# Patient Record
Sex: Male | Born: 2000
Health system: Southern US, Community
[De-identification: ages and names within clinical notes are randomized; demographics above are authoritative.]

## PROBLEM LIST (undated history)

## (undated) DIAGNOSIS — Q203 Discordant ventriculoarterial connection: Secondary | ICD-10-CM

## (undated) DIAGNOSIS — K219 Gastro-esophageal reflux disease without esophagitis: Secondary | ICD-10-CM

## (undated) HISTORY — PX: OTHER SURGICAL HISTORY: SHX169

## (undated) HISTORY — PX: TONSILLECTOMY: SUR1361

## (undated) HISTORY — PX: CARDIAC SURGERY: SHX584

---
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
## 2022-04-01 ENCOUNTER — Emergency Department (HOSPITAL_BASED_OUTPATIENT_CLINIC_OR_DEPARTMENT_OTHER)
Admission: EM | Admit: 2022-04-01 | Discharge: 2022-04-01 | Disposition: A | Discharge status: Home / Self Care | Payer: Medicaid Other | Attending: Emergency Medicine | Admitting: Emergency Medicine

## 2022-04-01 ENCOUNTER — Encounter (HOSPITAL_BASED_OUTPATIENT_CLINIC_OR_DEPARTMENT_OTHER): Payer: Self-pay | Admitting: Emergency Medicine

## 2022-04-01 ENCOUNTER — Other Ambulatory Visit: Payer: Self-pay

## 2022-04-01 ENCOUNTER — Emergency Department (HOSPITAL_BASED_OUTPATIENT_CLINIC_OR_DEPARTMENT_OTHER): Payer: Medicaid Other

## 2022-04-01 DIAGNOSIS — R079 Chest pain, unspecified: Secondary | ICD-10-CM

## 2022-04-01 DIAGNOSIS — R0789 Other chest pain: Secondary | ICD-10-CM | POA: Insufficient documentation

## 2022-04-01 LAB — CBC
HCT: 57.3 % — ABNORMAL HIGH (ref 39.0–52.0)
Hemoglobin: 20.5 g/dL — ABNORMAL HIGH (ref 13.0–17.0)
MCH: 30.3 pg (ref 26.0–34.0)
MCHC: 35.8 g/dL (ref 30.0–36.0)
MCV: 84.8 fL (ref 80.0–100.0)
Platelets: 286 10*3/uL (ref 150–400)
RBC: 6.76 MIL/uL — ABNORMAL HIGH (ref 4.22–5.81)
RDW: 12.8 % (ref 11.5–15.5)
WBC: 8.9 10*3/uL (ref 4.0–10.5)
nRBC: 0 % (ref 0.0–0.2)

## 2022-04-01 LAB — BASIC METABOLIC PANEL
Anion gap: 6 (ref 5–15)
BUN: 11 mg/dL (ref 6–20)
CO2: 27 mmol/L (ref 22–32)
Calcium: 8.7 mg/dL — ABNORMAL LOW (ref 8.9–10.3)
Chloride: 108 mmol/L (ref 98–111)
Creatinine, Ser: 0.97 mg/dL (ref 0.61–1.24)
GFR, Estimated: >60 mL/min (ref >60)
Glucose, Bld: 129 mg/dL — ABNORMAL HIGH (ref 70–99)
Potassium: 4.2 mmol/L (ref 3.5–5.1)
Sodium: 141 mmol/L (ref 135–145)

## 2022-04-01 LAB — TROPONIN I (HIGH SENSITIVITY): Troponin I (High Sensitivity): 3 ng/L (ref <18)

## 2022-04-01 NOTE — ED Notes (Signed)
Hx of heart problem. Surgery when infant to correct heart defect. Alert x4 . Denies any shortness of breath.Pain in 1 in chest area . Denies any other symptoms beside nausea

## 2022-04-01 NOTE — ED Notes (Signed)
Pt remains with his feet up. States he feels better, still noticeably pale in color. HR is 82bpm. States he feels "gassy, a little dizzy, slight chest pain and chills."

## 2022-04-01 NOTE — ED Notes (Signed)
Immediately following blood draw, pt states "I feel dizzy. My head is fuzzy and I have tunnel vision." Pulse ox applied and HR read 62 BPM and 98SpO2. Pt states "I'm about to pass out."   Pt's feet raised and head laid back in stretcher. Pt noticeably pale in color and diaphoretic. RN informed and Repeat EKG performed.

## 2022-04-01 NOTE — ED Provider Notes (Signed)
[facility] EMERGENCY DEPARTMENT Provider Note   CSN: [id number] Arrival date & time: 04/01/22  1232     History  Chief Complaint  Patient presents with   Chest Pain    Steve Henson is a 21 y.o. male with a past medical history of repair for transposition of the cardiac great arteries presenting today with chest pain.  He reports that this has been going on for a year.  Says the pains are intermittent and sometimes feels sharp, sometimes feel dull, sometimes feel crampy and that there is no rhyme or reason.  No associated shortness of breath or palpitations.  Reports donating plasma biweekly.  Today the chest pain was not worse than usual but he also was lightheaded.  This was present before he donated plasma today.  No history of DVT/PE, recent travel, recent surgery, familial coagulopathy or tobacco use.  Has seen a cardiologist, last visit in November 2022.   Chest Pain      Home Medications Prior to Admission medications   Medication Sig Start Date End Date Taking? Authorizing Provider  amoxicillin-clavulanate (AUGMENTIN) 875-125 MG tablet Take 1 tablet by mouth every 12 (twelve) hours. 02/08/21   Milagros Loll, MD  cephALEXin (KEFLEX) 500 MG capsule Take 1,000 mg by mouth 2 (two) times daily. 02/01/21   [provider]  famotidine (PEPCID) 20 MG tablet Take 1 tablet (20 mg total) by mouth 2 (two) times daily. 04/27/21   Palumbo, April, MD  potassium chloride SA (KLOR-CON) 20 MEQ tablet Take 1 tablet (20 mEq total) by mouth 2 (two) times daily. 05/07/21   Glynn Octave, MD      Allergies    Patient has no known allergies.    Review of Systems   Review of Systems  Cardiovascular:  Positive for chest pain.    Physical Exam Updated Vital Signs BP 113/71 (BP Location: Right Arm)   Pulse 74   Temp 98.5 F (36.9 C) (Oral)   Resp 18   Ht 6' (1.829 m)   Wt 90.7 kg   SpO2 100%   BMI 27.11 kg/m  Physical Exam Vitals and nursing note reviewed.   Constitutional:      Appearance: Normal appearance.  HENT:     Head: Normocephalic and atraumatic.  Eyes:     General: No scleral icterus.    Conjunctiva/sclera: Conjunctivae normal.  Cardiovascular:     Rate and Rhythm: Normal rate and regular rhythm.     Heart sounds: Normal heart sounds.  Pulmonary:     Effort: Pulmonary effort is normal. No respiratory distress.     Breath sounds: Normal breath sounds. No decreased breath sounds.  Chest:     Chest wall: No tenderness.  Musculoskeletal:        General: Normal range of motion.     Right lower leg: No edema.     Left lower leg: No edema.  Skin:    General: Skin is warm.     Findings: No rash.  Neurological:     Mental Status: He is alert.  Psychiatric:        Mood and Affect: Mood normal.    ED Results / Procedures / Treatments   Labs (all labs ordered are listed, but only abnormal results are displayed) Labs Reviewed  BASIC METABOLIC PANEL - Abnormal; Notable for the following components:      Result Value   Glucose, Bld 129 (*)    Calcium 8.7 (*)    All other  components within normal limits  CBC - Abnormal; Notable for the following components:   RBC 6.76 (*)    Hemoglobin 20.5 (*)    HCT 57.3 (*)    All other components within normal limits  TROPONIN I (HIGH SENSITIVITY)  TROPONIN I (HIGH SENSITIVITY)    EKG EKG Interpretation  Date/Time:  Saturday April 01 2022 13:02:41 EDT Ventricular Rate:  66 PR Interval:  140 QRS Duration: 88 QT Interval:  406 QTC Calculation: 425 R Axis:   96 Text Interpretation: Normal sinus rhythm Rightward axis ST elevation, consider early repolarization, pericarditis, or injury Abnormal ECG When compared with ECG of 01-Apr-2022 12:48, No significant change since last tracing Confirmed by Meridee Score [phone]) on 04/01/2022 1:08:50 PM  Radiology DG Chest 2 View  Result Date: 04/01/2022 CLINICAL DATA:  Chest pain. EXAM: CHEST - 2 VIEW COMPARISON:  July 31, 2021 FINDINGS:  Cardiomediastinal silhouette is normal. Mediastinal contours appear intact. There is no evidence of focal airspace consolidation, pleural effusion or pneumothorax. Osseous structures are without acute abnormality. Soft tissues are grossly normal. IMPRESSION: No active cardiopulmonary disease. Electronically Signed   By: Ted Mcalpine M.D.   On: 04/01/2022 14:24    Procedures Procedures   Medications Ordered in ED Medications - No data to display  ED Course/ Medical Decision Making/ A&P                           Medical Decision Making Amount and/or Complexity of Data Reviewed Labs: ordered. Radiology: ordered.   .This patient presents to the ED for concern of intermittent chest pain over the past year.  Differential includes but is not limited to ACS, PE, anxiety, electrolyte abnormality, dehydration, heart murmur.   This is not an exhaustive differential.    Past Medical History / Co-morbidities / Social History: History of cardiac surgery as an infant.   Additional history: Per chart review patient presents for similar symptoms multiple times each month.  He has not been seen by his cardiologist at [facility] since November 2022.   Physical Exam: Pertinent physical exam findings include normal cardiopulmonary exam.  Negative orthostatics  Lab Tests: I ordered, and personally interpreted labs.  The pertinent results include: Hemoglobin 20.5.  It was elevated 4 months ago as well.  Patient is PERC negative.  Doubt hypoxia secondary to    Imaging Studies: I ordered and independently visualized and interpreted imaging which showed no acute findings.   Cardiac Monitoring:  The patient was maintained on a cardiac monitor.  Patient remained in normal sinus rhythm throughout his stay   Medications: Declined the need for medication.  We discussed possible fluids secondary to dehydration and lightheadedness however patient says that he drinks plenty of water and receives a bag of  saline after donating plasma.  He did this today.  He and his mother do not believe it is necessary at this time.    Disposition: 21 year old male presenting with chest pain.  Says its been going on for a year.  Feels differently each time.  Mother says she brings him each time to make sure he is not having a heart attack.  Heart score 0 and troponin negative.  I believe is reasonable for him to follow-up with cardiology because he has not seen them since November.  He and his mother are agreeable to this.  They question whether or not he needs a heart catheterization.  I told him they should discuss this with  cardiology.  Agreeable to discharge at this time with strict return precautions.  Also given a referral to PCP where they can continue to follow his elevated hemoglobin.   Final Clinical Impression(s) / ED Diagnoses Final diagnoses:  Nonspecific chest pain    Rx / DC Orders ED Discharge Orders     None      Results and diagnoses were explained to the patient. Return precautions discussed in full. Patient had no additional questions and expressed complete understanding.   This chart was dictated using voice recognition software.  Despite best efforts to proofread,  errors can occur which can change the documentation meaning.     Woodroe Chen 04/01/22 1601    Terrilee Files, MD 04/01/22 Flossie Buffy

## 2022-04-01 NOTE — ED Triage Notes (Signed)
Pt arrives pov, steady gait, c/o generalized CP with nausea today. Hx of gerd,

## 2022-04-01 NOTE — Discharge Instructions (Signed)
It is very important for you to follow-up with cardiology.  They would be the ones to make any type of intervention if needed.  I have also attached a primary care office for you to establish care with.  Please call the number on Monday.  Return with any worsening symptoms.  Do not donate plasma on Tuesday either. It was a pleasure to meet you and I hope you feel better!

## 2022-08-14 ENCOUNTER — Emergency Department (HOSPITAL_BASED_OUTPATIENT_CLINIC_OR_DEPARTMENT_OTHER)
Admission: EM | Admit: 2022-08-14 | Discharge: 2022-08-15 | Disposition: A | Discharge status: Home / Self Care | Payer: Medicaid Other | Attending: Emergency Medicine | Admitting: Emergency Medicine

## 2022-08-14 ENCOUNTER — Encounter (HOSPITAL_BASED_OUTPATIENT_CLINIC_OR_DEPARTMENT_OTHER): Payer: Self-pay | Admitting: Urology

## 2022-08-14 ENCOUNTER — Other Ambulatory Visit: Payer: Self-pay

## 2022-08-14 DIAGNOSIS — R1031 Right lower quadrant pain: Secondary | ICD-10-CM | POA: Diagnosis present

## 2022-08-14 LAB — CBC
HCT: 50.3 % (ref 39.0–52.0)
Hemoglobin: 17.5 g/dL — ABNORMAL HIGH (ref 13.0–17.0)
MCH: 29.6 pg (ref 26.0–34.0)
MCHC: 34.8 g/dL (ref 30.0–36.0)
MCV: 85.1 fL (ref 80.0–100.0)
Platelets: 270 10*3/uL (ref 150–400)
RBC: 5.91 MIL/uL — ABNORMAL HIGH (ref 4.22–5.81)
RDW: 12.1 % (ref 11.5–15.5)
WBC: 10.1 10*3/uL (ref 4.0–10.5)
nRBC: 0 % (ref 0.0–0.2)

## 2022-08-14 LAB — COMPREHENSIVE METABOLIC PANEL
ALT: 14 U/L (ref 0–44)
AST: 16 U/L (ref 15–41)
Albumin: 3.8 g/dL (ref 3.5–5.0)
Alkaline Phosphatase: 51 U/L (ref 38–126)
Anion gap: 5 (ref 5–15)
BUN: 8 mg/dL (ref 6–20)
CO2: 30 mmol/L (ref 22–32)
Calcium: 8.7 mg/dL — ABNORMAL LOW (ref 8.9–10.3)
Chloride: 106 mmol/L (ref 98–111)
Creatinine, Ser: 1.02 mg/dL (ref 0.61–1.24)
GFR, Estimated: >60 mL/min (ref >60)
Glucose, Bld: 105 mg/dL — ABNORMAL HIGH (ref 70–99)
Potassium: 4 mmol/L (ref 3.5–5.1)
Sodium: 141 mmol/L (ref 135–145)
Total Bilirubin: 0.6 mg/dL (ref 0.3–1.2)
Total Protein: 6.4 g/dL — ABNORMAL LOW (ref 6.5–8.1)

## 2022-08-14 LAB — URINALYSIS, ROUTINE W REFLEX MICROSCOPIC
Bilirubin Urine: NEGATIVE
Glucose, UA: NEGATIVE mg/dL
Hgb urine dipstick: NEGATIVE
Ketones, ur: NEGATIVE mg/dL
Leukocytes,Ua: NEGATIVE
Nitrite: NEGATIVE
Protein, ur: NEGATIVE mg/dL
Specific Gravity, Urine: 1.02 (ref 1.005–1.030)
pH: 8.5 — ABNORMAL HIGH (ref 5.0–8.0)

## 2022-08-14 LAB — LIPASE, BLOOD: Lipase: 37 U/L (ref 11–51)

## 2022-08-14 NOTE — ED Provider Notes (Signed)
[facility] Provider Note: Lowella Dell, MD, FACEP  CSN: [id number] MRN: [id number] ARRIVAL: 08/14/22 at 1758 ROOM: [facility]03/[facility]03   CHIEF COMPLAINT  Abdominal Pain   HISTORY OF PRESENT ILLNESS  08/14/22 11:57 PM Steve Henson is a 21 y.o. male with right lower quadrant abdominal pain that started about 6 AM.  It was initially the constant but subsequently went away at rest and is now only present when he moves or palpates the right lower quadrant.  There is no pain at rest but pain with palpation is about a 3 out of 10.  He has had no associated nausea, vomiting, diarrhea, dysuria, fever or chills.   Past Medical History:  Diagnosis Date   Transposition great arteries     Past Surgical History:  Procedure Laterality Date   CARDIAC SURGERY     open heart surgery     TONSILLECTOMY      History reviewed. No pertinent family history.  Social History   Tobacco Use   Smoking status: Never    Passive exposure: Never   Smokeless tobacco: Never  Vaping Use   Vaping Use: Never used  Substance Use Topics   Alcohol use: No   Drug use: No    Prior to Admission medications   Medication Sig Start Date End Date Taking? Authorizing Provider  amoxicillin-clavulanate (AUGMENTIN) 875-125 MG tablet Take 1 tablet by mouth every 12 (twelve) hours. 02/08/21   Milagros Loll, MD  cephALEXin (KEFLEX) 500 MG capsule Take 1,000 mg by mouth 2 (two) times daily. 02/01/21   [provider]  famotidine (PEPCID) 20 MG tablet Take 1 tablet (20 mg total) by mouth 2 (two) times daily. 04/27/21   Palumbo, April, MD  potassium chloride SA (KLOR-CON) 20 MEQ tablet Take 1 tablet (20 mEq total) by mouth 2 (two) times daily. 05/07/21   Glynn Octave, MD    Allergies Patient has no known allergies.   REVIEW OF SYSTEMS  Negative except as noted here or in the History of Present Illness.   PHYSICAL EXAMINATION  Initial Vital Signs Blood pressure (!) 147/74, pulse 86,  temperature 98.1 F (36.7 C), temperature source Oral, resp. rate 18, height 6' (1.829 m), weight 90.4 kg, SpO2 97 %.  Examination General: Well-developed, well-nourished male in no acute distress; appearance consistent with age of record HENT: normocephalic; atraumatic Eyes: Normal appearance Neck: supple Heart: regular rate and rhythm Lungs: clear to auscultation bilaterally Abdomen: soft; nondistended; right lower quadrant tenderness; bowel sounds present Extremities: No deformity; full range of motion Neurologic: Awake, alert and oriented; motor function intact in all extremities and symmetric; no facial droop Skin: Warm and dry Psychiatric: Normal mood and affect   RESULTS  Summary of this visit's results, reviewed and interpreted by myself:   EKG Interpretation  Date/Time:    Ventricular Rate:    PR Interval:    QRS Duration:   QT Interval:    QTC Calculation:   R Axis:     Text Interpretation:         Laboratory Studies: Results for orders placed or performed during the hospital encounter of 08/14/22 (from the past 24 hour(s))  Lipase, blood     Status: None   Collection Time: 08/14/22  6:23 PM  Result Value Ref Range   Lipase 37 11 - 51 U/L  Comprehensive metabolic panel     Status: Abnormal   Collection Time: 08/14/22  6:23 PM  Result Value Ref Range   Sodium 141  135 - 145 mmol/L   Potassium 4.0 3.5 - 5.1 mmol/L   Chloride 106 98 - 111 mmol/L   CO2 30 22 - 32 mmol/L   Glucose, Bld 105 (H) 70 - 99 mg/dL   BUN 8 6 - 20 mg/dL   Creatinine, Ser 5.40 0.61 - 1.24 mg/dL   Calcium 8.7 (L) 8.9 - 10.3 mg/dL   Total Protein 6.4 (L) 6.5 - 8.1 g/dL   Albumin 3.8 3.5 - 5.0 g/dL   AST 16 15 - 41 U/L   ALT 14 0 - 44 U/L   Alkaline Phosphatase 51 38 - 126 U/L   Total Bilirubin 0.6 0.3 - 1.2 mg/dL   GFR, Estimated >98 >11 mL/min   Anion gap 5 5 - 15  CBC     Status: Abnormal   Collection Time: 08/14/22  6:23 PM  Result Value Ref Range   WBC 10.1 4.0 - 10.5 K/uL    RBC 5.91 (H) 4.22 - 5.81 MIL/uL   Hemoglobin 17.5 (H) 13.0 - 17.0 g/dL   HCT 91.4 78.2 - 95.6 %   MCV 85.1 80.0 - 100.0 fL   MCH 29.6 26.0 - 34.0 pg   MCHC 34.8 30.0 - 36.0 g/dL   RDW 21.3 08.6 - 57.8 %   Platelets 270 150 - 400 K/uL   nRBC 0.0 0.0 - 0.2 %  Urinalysis, Routine w reflex microscopic     Status: Abnormal   Collection Time: 08/14/22  6:23 PM  Result Value Ref Range   Color, Urine YELLOW YELLOW   APPearance CLEAR CLEAR   Specific Gravity, Urine 1.020 1.005 - 1.030   pH 8.5 (H) 5.0 - 8.0   Glucose, UA NEGATIVE NEGATIVE mg/dL   Hgb urine dipstick NEGATIVE NEGATIVE   Bilirubin Urine NEGATIVE NEGATIVE   Ketones, ur NEGATIVE NEGATIVE mg/dL   Protein, ur NEGATIVE NEGATIVE mg/dL   Nitrite NEGATIVE NEGATIVE   Leukocytes,Ua NEGATIVE NEGATIVE   Imaging Studies: No results found.  ED COURSE and MDM  Nursing notes, initial and subsequent vitals signs, including pulse oximetry, reviewed and interpreted by myself.  Vitals:   08/14/22 1821 08/14/22 1821  BP:  (!) 147/74  Pulse:  86  Resp:  18  Temp:  98.1 F (36.7 C)  TempSrc:  Oral  SpO2:  97%  Weight: 90.4 kg   Height: 6' (1.829 m)    Medications - No data to display    PROCEDURES  Procedures   ED DIAGNOSES  No diagnosis found.

## 2022-08-14 NOTE — ED Triage Notes (Signed)
RLQ pain that started at 0600 today  Denies N/V Denies fever Normal BM today

## 2022-08-15 ENCOUNTER — Emergency Department (HOSPITAL_BASED_OUTPATIENT_CLINIC_OR_DEPARTMENT_OTHER): Payer: Medicaid Other

## 2022-08-15 MED ORDER — IOHEXOL 300 MG/ML  SOLN
100.0000 mL | Freq: Once | INTRAMUSCULAR | Status: AC | PRN
  Administered 2022-08-15: 100 mL via INTRAVENOUS

## 2022-09-03 IMAGING — DX DG CHEST 1V PORT
1 series · 1 of 1 positions shown · non-contrast
Comparison: April 26, 2021

CLINICAL DATA: Chest pain and palpitations x1 week.

EXAM:
PORTABLE CHEST 1 VIEW

[chest ap]
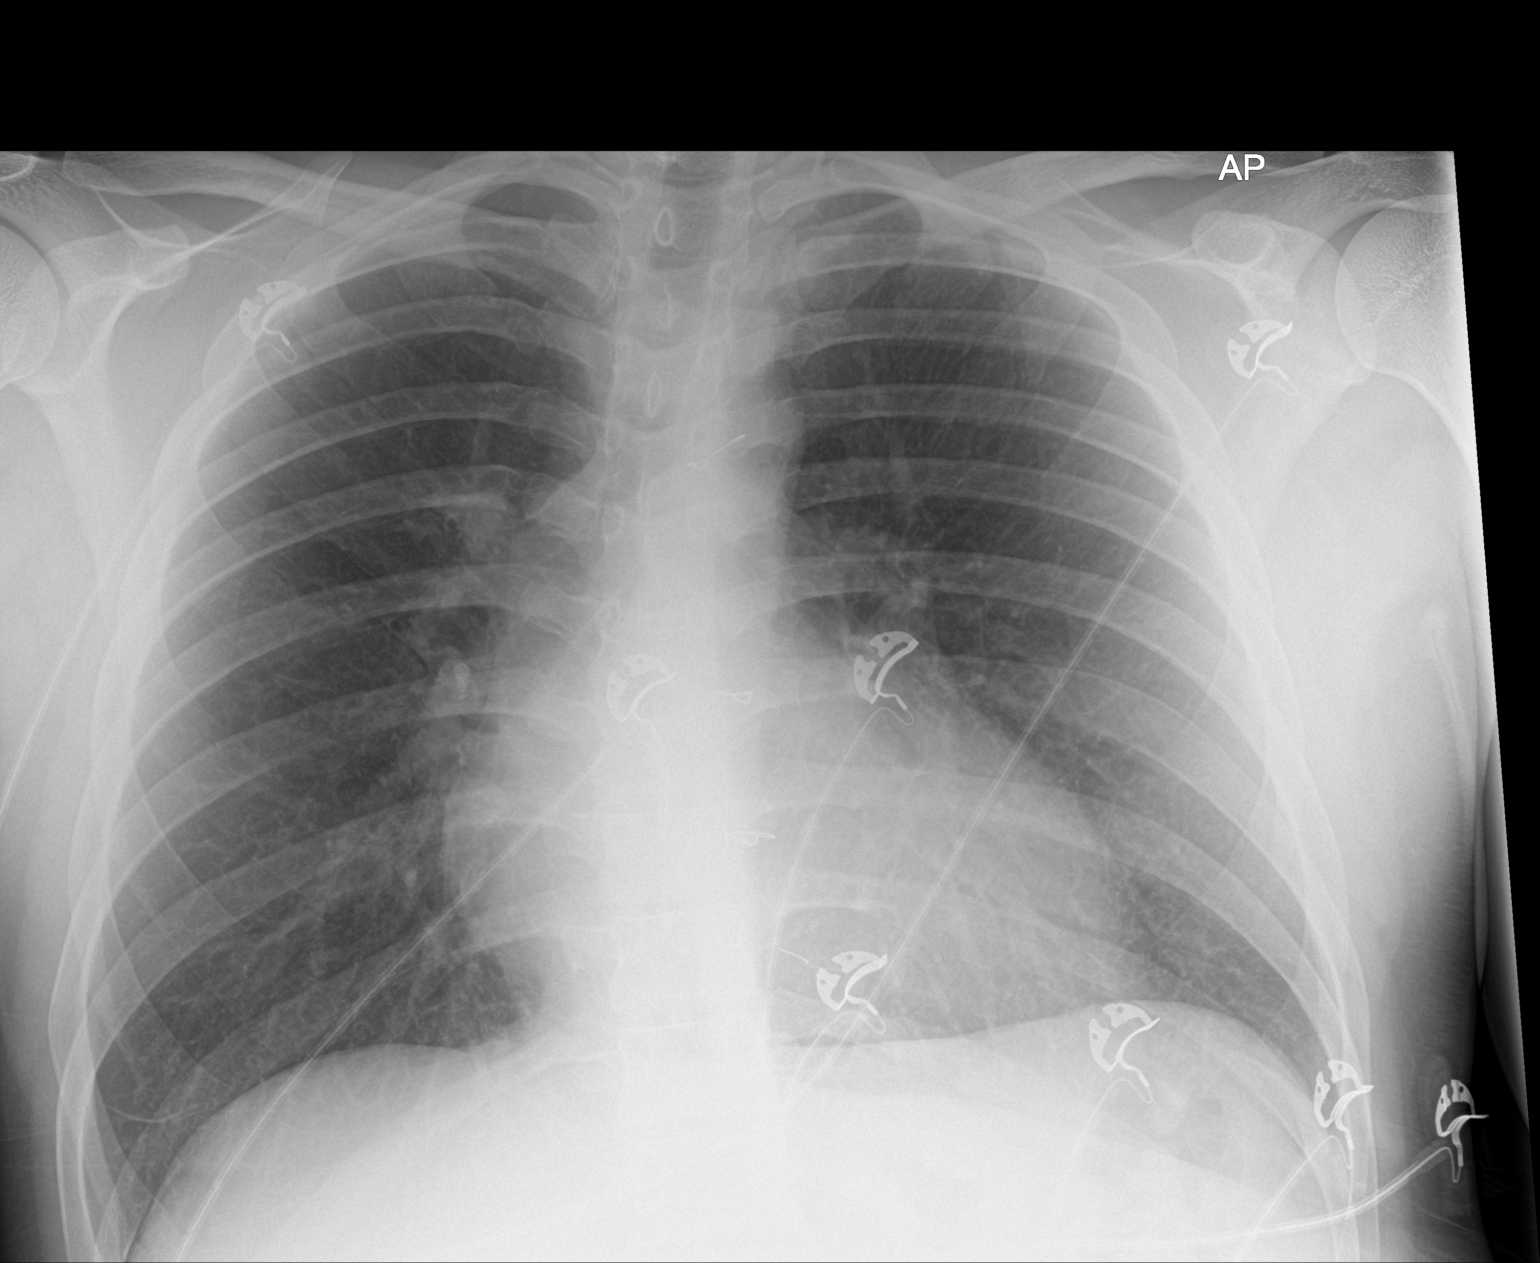

[1 of 1 positions shown; findings below may reference images not displayed]

FINDINGS: Multiple sternal wires are present. A trace amount of linear
atelectasis is seen within the lateral aspect of the right lung
base. There is no evidence of acute infiltrate, pleural effusion or
pneumothorax. The heart size and mediastinal contours are within
normal limits. The visualized skeletal structures are unremarkable.
IMPRESSION: No acute cardiopulmonary disease.

## 2022-09-13 IMAGING — DX DG CHEST 2V
2 series · 2 of 2 positions shown · non-contrast
Comparison: Chest radiograph dated 05/05/2021.

CLINICAL DATA: Chest pain.

EXAM:
CHEST - 2 VIEW

[chest pa]
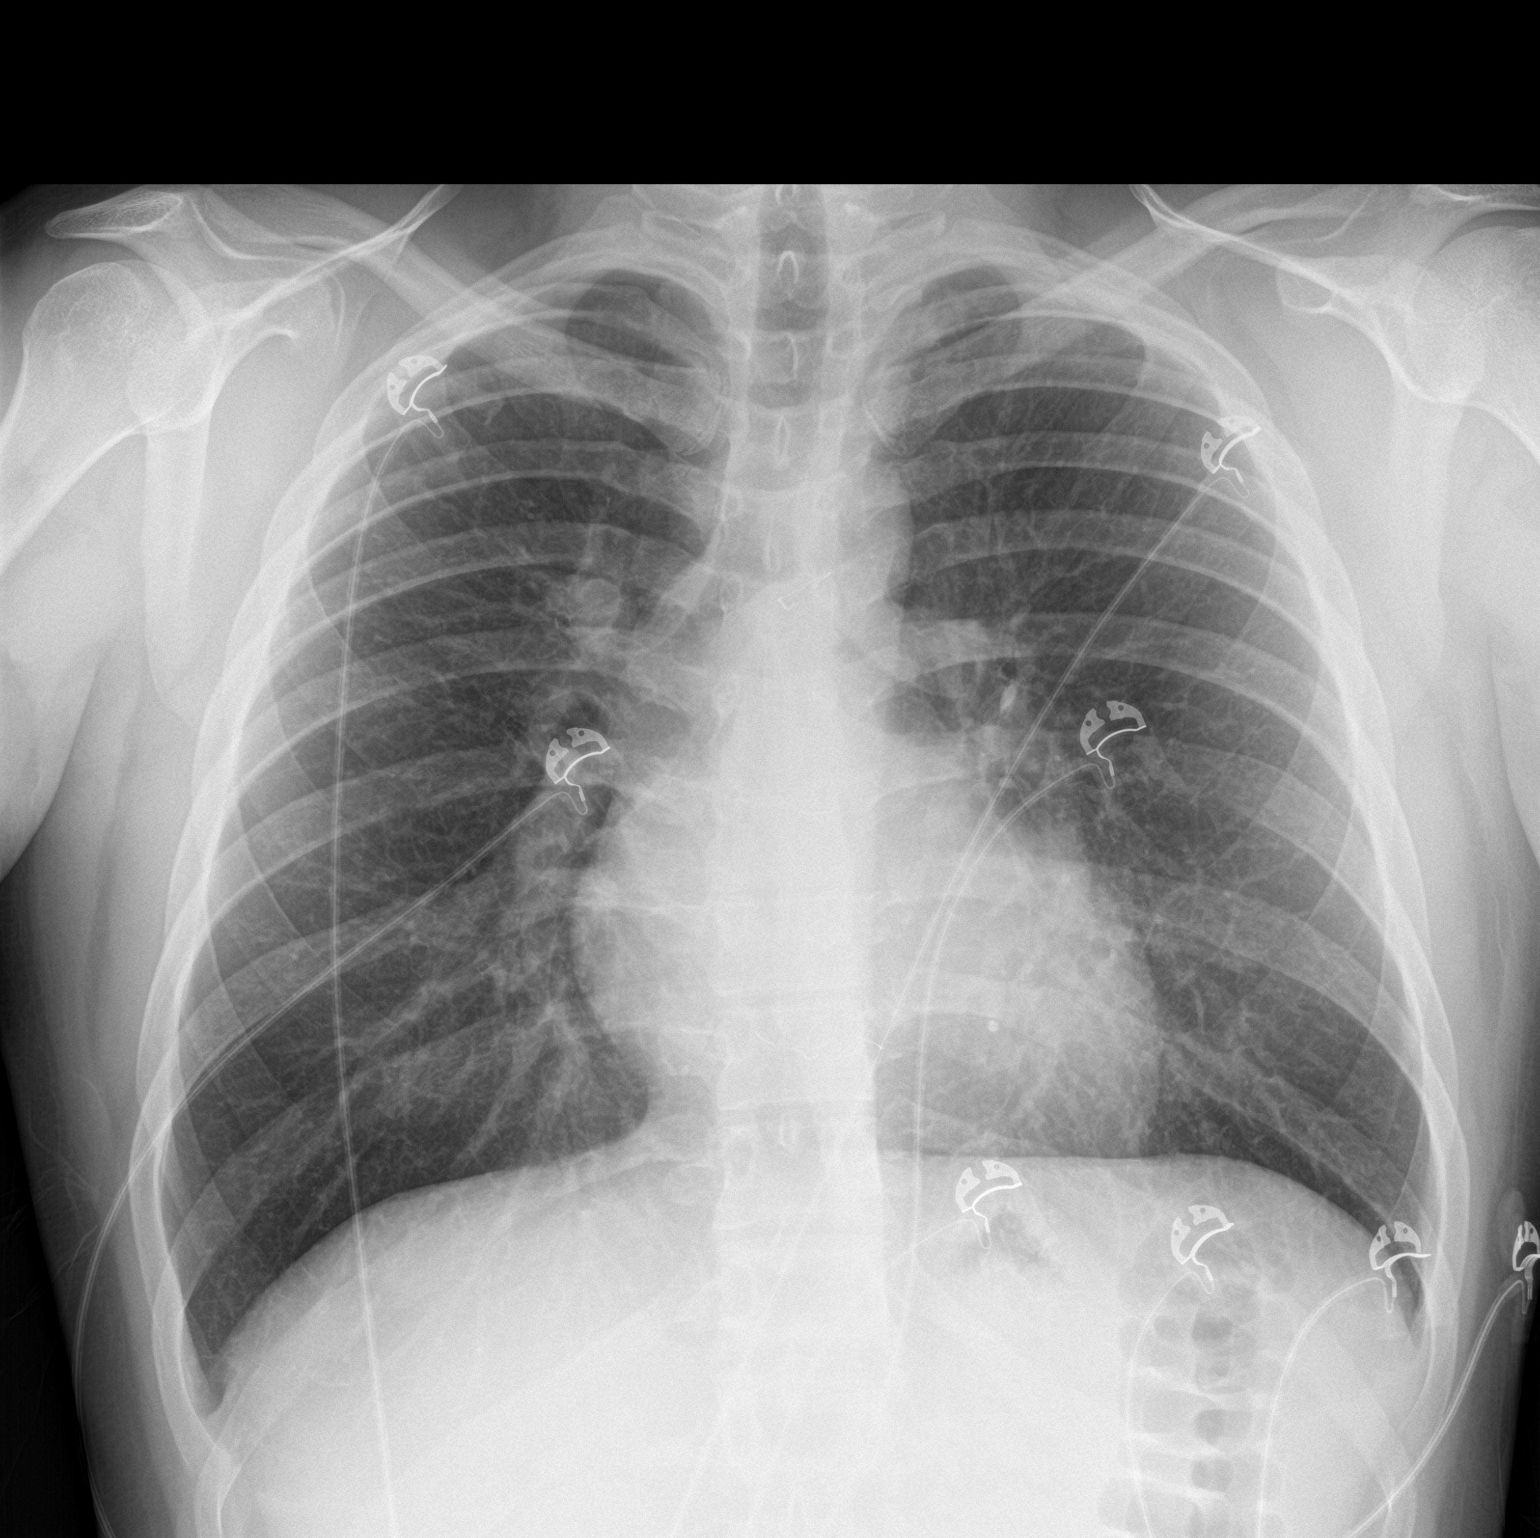

[chest lat]
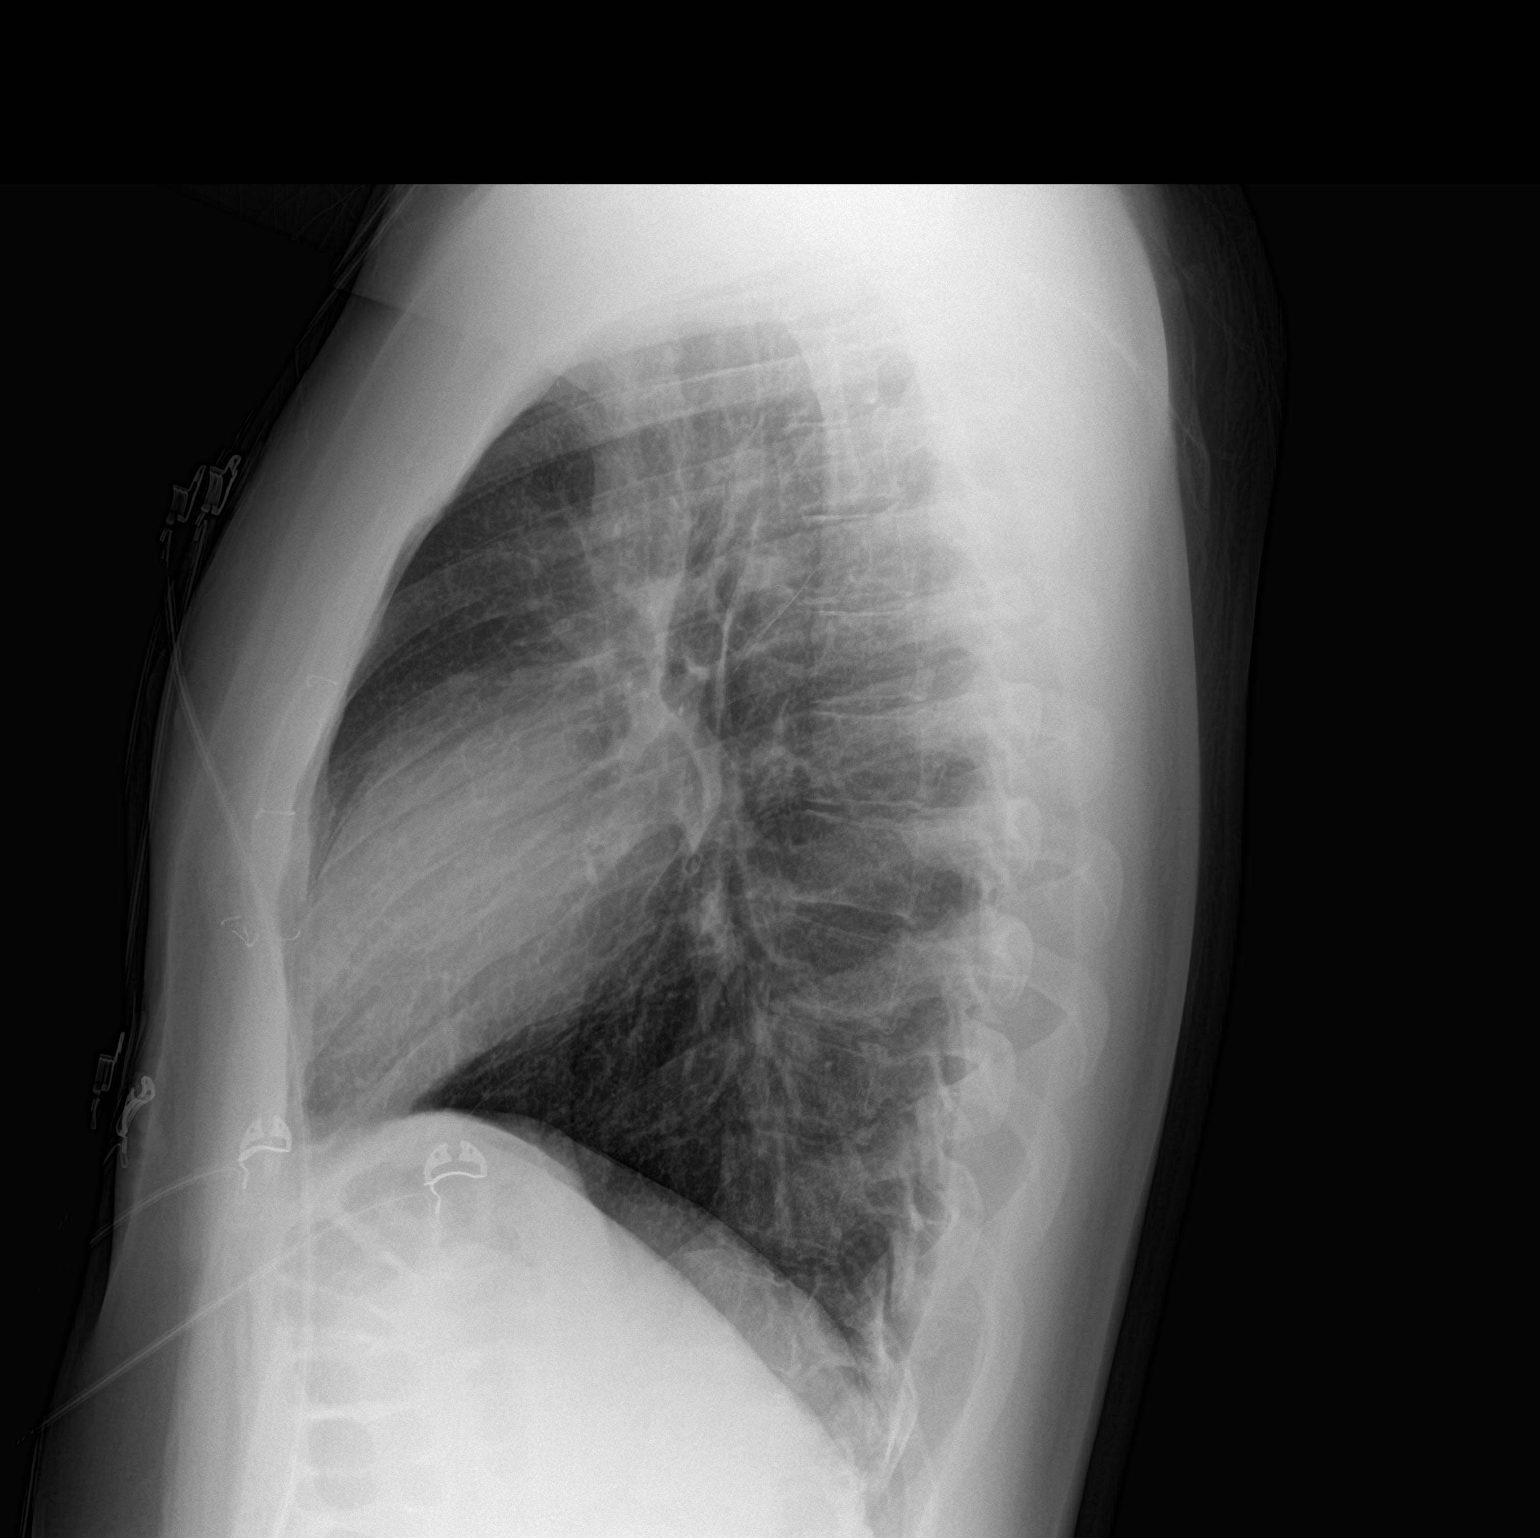

[2 of 2 positions shown; findings below may reference images not displayed]

FINDINGS: The heart size and mediastinal contours are within normal limits.
Both lungs are clear. The visualized skeletal structures are
unremarkable.
IMPRESSION: No active cardiopulmonary disease.

## 2022-09-26 IMAGING — DX DG CHEST 2V
2 series · 2 of 2 positions shown · non-contrast
Comparison: 05/07/2021

CLINICAL DATA: Chest pain

EXAM:
CHEST - 2 VIEW

[chest pa]
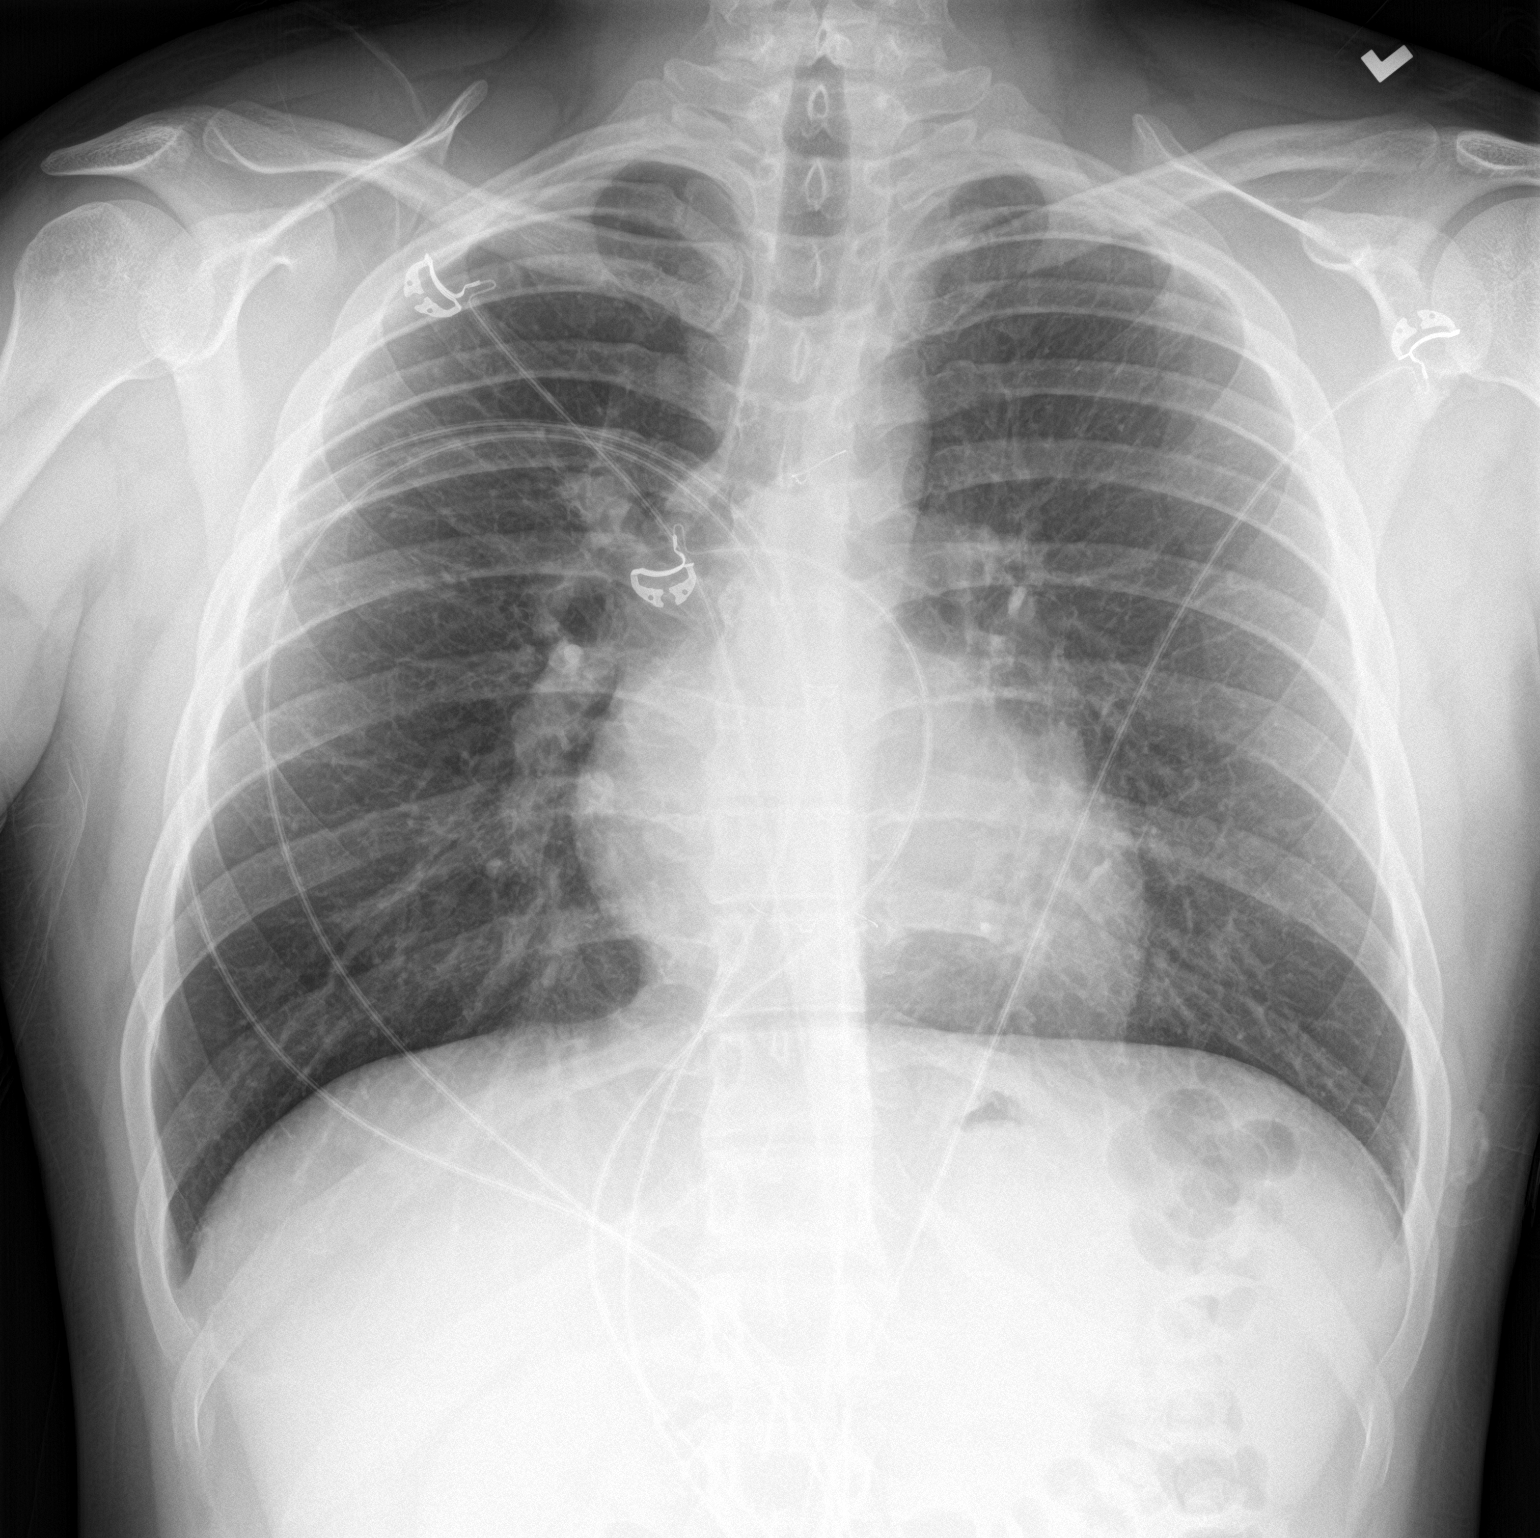

[chest lat]
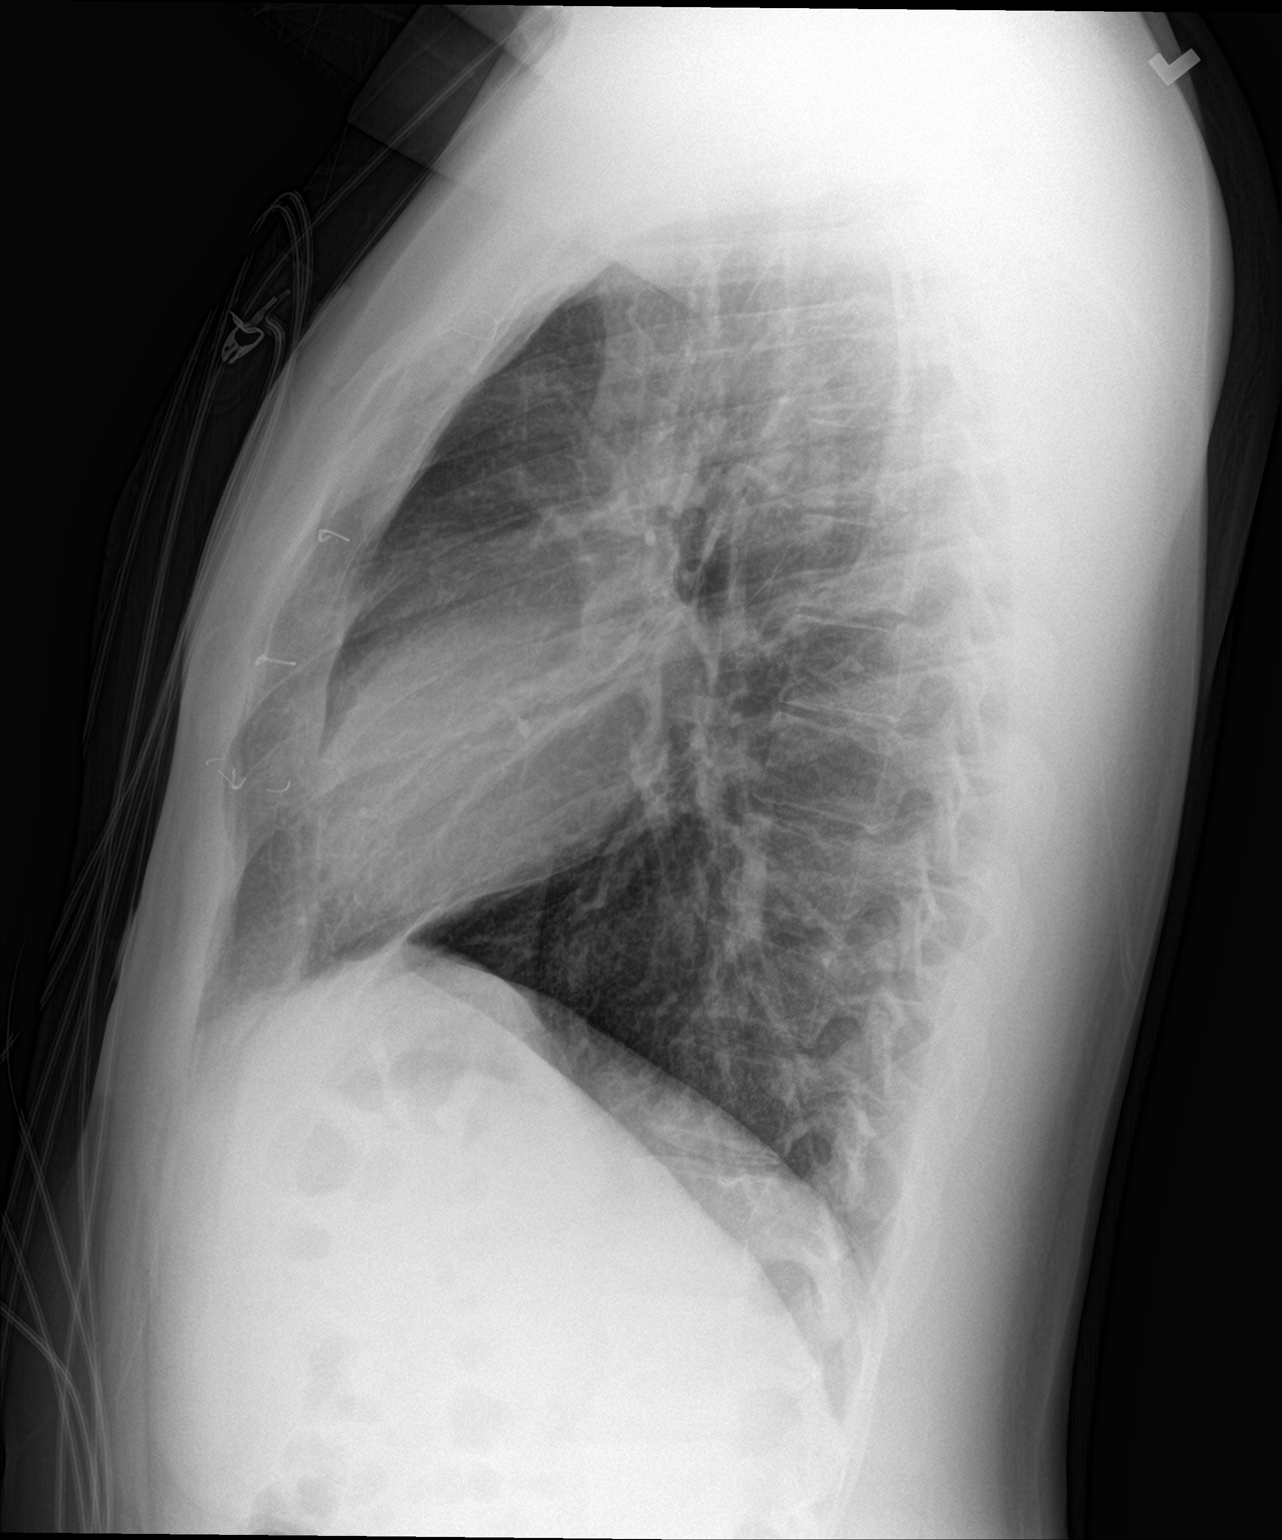

[2 of 2 positions shown; findings below may reference images not displayed]

FINDINGS: The heart size and mediastinal contours are within normal limits.
Both lungs are clear. The visualized skeletal structures are
unremarkable. Retained wires from remote prior median sternotomy.
IMPRESSION: No active cardiopulmonary disease.

## 2022-12-25 ENCOUNTER — Emergency Department (HOSPITAL_BASED_OUTPATIENT_CLINIC_OR_DEPARTMENT_OTHER): Payer: Medicaid Other

## 2022-12-25 ENCOUNTER — Other Ambulatory Visit: Payer: Self-pay

## 2022-12-25 ENCOUNTER — Encounter (HOSPITAL_BASED_OUTPATIENT_CLINIC_OR_DEPARTMENT_OTHER): Payer: Self-pay

## 2022-12-25 ENCOUNTER — Emergency Department (HOSPITAL_BASED_OUTPATIENT_CLINIC_OR_DEPARTMENT_OTHER)
Admission: EM | Admit: 2022-12-25 | Discharge: 2022-12-25 | Disposition: A | Discharge status: Home / Self Care | Payer: Medicaid Other | Attending: Emergency Medicine | Admitting: Emergency Medicine

## 2022-12-25 DIAGNOSIS — R002 Palpitations: Secondary | ICD-10-CM | POA: Diagnosis present

## 2022-12-25 DIAGNOSIS — E876 Hypokalemia: Secondary | ICD-10-CM

## 2022-12-25 HISTORY — DX: Gastro-esophageal reflux disease without esophagitis: K21.9

## 2022-12-25 LAB — CBC
HCT: 51.8 % (ref 39.0–52.0)
Hemoglobin: 18 g/dL — ABNORMAL HIGH (ref 13.0–17.0)
MCH: 29.3 pg (ref 26.0–34.0)
MCHC: 34.7 g/dL (ref 30.0–36.0)
MCV: 84.2 fL (ref 80.0–100.0)
Platelets: 223 10*3/uL (ref 150–400)
RBC: 6.15 MIL/uL — ABNORMAL HIGH (ref 4.22–5.81)
RDW: 12.5 % (ref 11.5–15.5)
WBC: 8.1 10*3/uL (ref 4.0–10.5)
nRBC: 0 % (ref 0.0–0.2)

## 2022-12-25 LAB — MAGNESIUM: Magnesium: 1.9 mg/dL (ref 1.7–2.4)

## 2022-12-25 LAB — BASIC METABOLIC PANEL
Anion gap: 11 (ref 5–15)
BUN: 9 mg/dL (ref 6–20)
CO2: 24 mmol/L (ref 22–32)
Calcium: 9.3 mg/dL (ref 8.9–10.3)
Chloride: 103 mmol/L (ref 98–111)
Creatinine, Ser: 1.01 mg/dL (ref 0.61–1.24)
GFR, Estimated: >60 mL/min (ref >60)
Glucose, Bld: 97 mg/dL (ref 70–99)
Potassium: 3.4 mmol/L — ABNORMAL LOW (ref 3.5–5.1)
Sodium: 138 mmol/L (ref 135–145)

## 2022-12-25 LAB — TROPONIN I (HIGH SENSITIVITY): Troponin I (High Sensitivity): 3 ng/L (ref <18)

## 2022-12-25 MED ORDER — POTASSIUM CHLORIDE CRYS ER 20 MEQ PO TBCR
20.0000 meq | EXTENDED_RELEASE_TABLET | Freq: Once | ORAL | Status: AC
  Administered 2022-12-25: 20 meq via ORAL
  Filled 2022-12-25: qty 1

## 2022-12-25 NOTE — ED Provider Notes (Signed)
[place] [facility] Provider Note   CSN: [id number] Arrival date & time: 12/25/22  1820     History  Chief Complaint  Patient presents with   Palpitations   HPI Steve Henson is a 22 y.o. 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  Occurred today.  States has a sensation of irregular beats that can occur from time to time but today it occurred 5 times today which was concerning to him.  Also mention that he has been lightheaded for the last week.  Denies associated shortness of breath.  States he has had some intermittent chest pain but states it is related to his GERD.  Denies chest pain at this time.   Palpitations     Home Medications Prior to Admission medications   Medication Sig Start Date End Date Taking? Authorizing Provider  famotidine (PEPCID) 20 MG tablet Take 1 tablet (20 mg total) by mouth 2 (two) times daily. 04/27/21   Palumbo, April, MD  potassium chloride SA (KLOR-CON) 20 MEQ tablet Take 1 tablet (20 mEq total) by mouth 2 (two) times daily. 05/07/21   Glynn Octave, MD      Allergies    Patient has no known allergies.    Review of Systems   Review of Systems  Cardiovascular:  Positive for palpitations.    Physical Exam   Vitals:   12/25/22 1825  BP: (!) 142/74  Pulse: 86  Resp: 20  Temp: 97.8 F (36.6 C)  SpO2: 99%    CONSTITUTIONAL:  well-appearing, NAD NEURO:  Alert and oriented x 3, CN 3-12 grossly intact EYES:  eyes equal and reactive ENT/NECK:  Supple, no stridor  CARDIO:  regular rate and rhythm, appears well-perfused  PULM:  No respiratory distress, CTAB GI/GU:  non-distended, soft MSK/SPINE:  No gross deformities, no edema, moves all extremities  SKIN:  pale, no rash, atraumatic   *Additional and/or pertinent findings included in MDM below   ED Results / Procedures / Treatments   Labs (all labs  ordered are listed, but only abnormal results are displayed) Labs Reviewed  BASIC METABOLIC PANEL - Abnormal; Notable for the following components:      Result Value   Potassium 3.4 (*)    All other components within normal limits  CBC - Abnormal; Notable for the following components:   RBC 6.15 (*)    Hemoglobin 18.0 (*)    All other components within normal limits  MAGNESIUM  TROPONIN I (HIGH SENSITIVITY)    EKG EKG Interpretation  Date/Time:  Monday December 25 2022 18:27:22 EDT Ventricular Rate:  92 PR Interval:  119 QRS Duration: 100 QT Interval:  361 QTC Calculation: 447 R Axis:   100 Text Interpretation: Sinus rhythm Borderline short PR interval Consider right ventricular hypertrophy ST elev, probable normal early repol pattern Confirmed by Glyn Ade [phone]) on 12/25/2022 8:56:56 PM  Radiology DG Chest 2 View  Result Date: 12/25/2022 CLINICAL DATA:  Palpitations. EXAM: CHEST - 2 VIEW COMPARISON:  April 01, 2022. FINDINGS: The heart size and mediastinal contours are within normal limits. Both lungs are clear. The visualized skeletal structures are unremarkable. IMPRESSION: No active cardiopulmonary disease. Electronically Signed   By: Lupita Raider M.D.   On: 12/25/2022 18:54    Procedures Procedures    Medications Ordered in ED Medications  potassium chloride SA (KLOR-CON M) CR tablet 20 mEq (20 mEq Oral Given  12/25/22 2104)    ED Course/ Medical Decision Making/ A&P Clinical Course as of 12/25/22 2106  Mon Dec 25, 2022  2055 Stable  21YOM with a history TGA Here with chief complaint of palpitations. K borderline and replace.   [CC]    Clinical Course User Index [CC] Glyn Ade, MD                             Medical Decision Making Amount and/or Complexity of Data Reviewed Labs: ordered. Radiology: ordered.   21 year old well-appearing presenting for palpitations.  Exam was unremarkable.  DDx includes arrhythmia, ACS, electrolyte  derangement, CHF or other.  Workup was overall reassuring.  Labs did reveal mild hypokalemia.  Repleted with oral potassium.  Symptoms could be related to ventricular ectopy secondary to hypokalemia.  EKG was nonischemic and without chest pain making ACS unlikely.  Advised that he follow-up with his cardiologist.  Discussed return precautions.  Vital stable discharge.        Final Clinical Impression(s) / ED Diagnoses Final diagnoses:  Hypokalemia    Rx / DC Orders ED Discharge Orders     None         Gareth Eagle, PA-C 12/25/22 2106    Glyn Ade, MD 12/26/22 [phone]

## 2022-12-25 NOTE — ED Triage Notes (Signed)
Pt reports palpitation described as cramping. Occurred x 5 today. Lightheaded x 1 week. States "chest feels empty"  Hx of GERD

## 2022-12-25 NOTE — Discharge Instructions (Signed)
Evaluation today revealed that he did have low potassium which could be causing the sensation of palpitations.  Recommend that you introduce potassium rich foods in your diet and follow-up with your cardiologist.  If you have new shortness of breath, chest pain, persistent severe palpitations or any other concerning symptom please return emergency department further evaluation.

## 2023-04-24 ENCOUNTER — Emergency Department (HOSPITAL_BASED_OUTPATIENT_CLINIC_OR_DEPARTMENT_OTHER): Payer: Medicaid Other

## 2023-04-24 ENCOUNTER — Encounter (HOSPITAL_BASED_OUTPATIENT_CLINIC_OR_DEPARTMENT_OTHER): Payer: Self-pay

## 2023-04-24 ENCOUNTER — Emergency Department (HOSPITAL_BASED_OUTPATIENT_CLINIC_OR_DEPARTMENT_OTHER)
Admission: EM | Admit: 2023-04-24 | Discharge: 2023-04-24 | Disposition: A | Discharge status: Home / Self Care | Payer: Medicaid Other | Attending: Emergency Medicine | Admitting: Emergency Medicine

## 2023-04-24 ENCOUNTER — Other Ambulatory Visit: Payer: Self-pay

## 2023-04-24 DIAGNOSIS — R5383 Other fatigue: Secondary | ICD-10-CM | POA: Diagnosis present

## 2023-04-24 DIAGNOSIS — R42 Dizziness and giddiness: Secondary | ICD-10-CM | POA: Insufficient documentation

## 2023-04-24 LAB — COMPREHENSIVE METABOLIC PANEL
ALT: 27 U/L (ref 0–44)
AST: 21 U/L (ref 15–41)
Albumin: 4.3 g/dL (ref 3.5–5.0)
Alkaline Phosphatase: 59 U/L (ref 38–126)
Anion gap: 12 (ref 5–15)
BUN: 10 mg/dL (ref 6–20)
CO2: 25 mmol/L (ref 22–32)
Calcium: 9.1 mg/dL (ref 8.9–10.3)
Chloride: 101 mmol/L (ref 98–111)
Creatinine, Ser: 1.03 mg/dL (ref 0.61–1.24)
GFR, Estimated: >60 mL/min (ref >60)
Glucose, Bld: 103 mg/dL — ABNORMAL HIGH (ref 70–99)
Potassium: 3.8 mmol/L (ref 3.5–5.1)
Sodium: 138 mmol/L (ref 135–145)
Total Bilirubin: 0.9 mg/dL (ref 0.3–1.2)
Total Protein: 7.4 g/dL (ref 6.5–8.1)

## 2023-04-24 LAB — CBC
HCT: 49 % (ref 39.0–52.0)
Hemoglobin: 17.1 g/dL — ABNORMAL HIGH (ref 13.0–17.0)
MCH: 29.6 pg (ref 26.0–34.0)
MCHC: 34.9 g/dL (ref 30.0–36.0)
MCV: 84.9 fL (ref 80.0–100.0)
Platelets: 246 10*3/uL (ref 150–400)
RBC: 5.77 MIL/uL (ref 4.22–5.81)
RDW: 12.3 % (ref 11.5–15.5)
WBC: 6.5 10*3/uL (ref 4.0–10.5)
nRBC: 0 % (ref 0.0–0.2)

## 2023-04-24 LAB — GLUCOSE, CAPILLARY: Glucose-Capillary: 93 mg/dL (ref 70–99)

## 2023-04-24 LAB — TROPONIN I (HIGH SENSITIVITY)
Troponin I (High Sensitivity): 3 ng/L (ref <18)
Troponin I (High Sensitivity): 3 ng/L (ref <18)

## 2023-04-24 NOTE — Discharge Instructions (Signed)
Recommend follow up with cardiology if symptoms of severe fatigue continue.   GI referral provided as discussed for further evaluation and management of ongoing/severe reflux.

## 2023-04-24 NOTE — ED Notes (Signed)
Delay in Triage as Patient is currently in restroom.

## 2023-04-24 NOTE — ED Triage Notes (Signed)
Patient here POV from Home with Family.  Endorses lightheadedness that began about an hour ago. Noted Pain to Right Lower chest that began soon after. Some Nausea. No Emesis. No Fevers. No discernable SOB.   Pain subsided about 20 minutes ago.   NAD Noted during Triage. A&Ox4. GCS 15. Ambulatory.

## 2023-04-24 NOTE — ED Provider Notes (Signed)
[place] [facility] Provider Note   CSN: [id number] Arrival date & time: 04/24/23  1752     History  Chief Complaint  Patient presents with   Dizziness    Steve Henson is a 22 y.o. male.  Patient to ED with sudden onset of fatigue this afternoon about 5:00 pm. He had a brief episode of pain/discomfort in the right lower chest that has resolved. No SOB, nausea, vomiting. He reports history of GERD and started omeprazole 4 days ago that he reports as ineffective. Symptoms of GERD are longstanding and without new aspect today. He also endorses lightheadedness associated with reflux also unchanged today.   The history is provided by the patient and a relative. No language interpreter was used.  Dizziness      Home Medications Prior to Admission medications   Medication Sig Start Date End Date Taking? Authorizing Provider  famotidine (PEPCID) 20 MG tablet Take 1 tablet (20 mg total) by mouth 2 (two) times daily. 04/27/21   Palumbo, April, MD  potassium chloride SA (KLOR-CON) 20 MEQ tablet Take 1 tablet (20 mEq total) by mouth 2 (two) times daily. 05/07/21   Glynn Octave, MD      Allergies    Patient has no known allergies.    Review of Systems   Review of Systems  Neurological:  Positive for dizziness.    Physical Exam Updated Vital Signs BP 130/76   Pulse 64   Temp 98.4 F (36.9 C)   Resp 17   Ht 6' (1.829 m)   Wt 97.5 kg   SpO2 98%   BMI 29.16 kg/m  Physical Exam Vitals and nursing note reviewed.  Constitutional:      General: He is not in acute distress.    Appearance: He is not ill-appearing.  Cardiovascular:     Rate and Rhythm: Normal rate and regular rhythm.     Heart sounds: No murmur heard. Pulmonary:     Breath sounds: No wheezing, rhonchi or rales.  Abdominal:     Palpations: Abdomen is soft.     Tenderness: There is no abdominal tenderness.  Musculoskeletal:        General: Normal range of motion.      Cervical back: Normal range of motion and neck supple.  Skin:    General: Skin is warm and dry.  Neurological:     Mental Status: He is alert and oriented to person, place, and time.     Sensory: No sensory deficit.     Motor: No weakness.     ED Results / Procedures / Treatments   Labs (all labs ordered are listed, but only abnormal results are displayed) Labs Reviewed  CBC - Abnormal; Notable for the following components:      Result Value   Hemoglobin 17.1 (*)    All other components within normal limits  COMPREHENSIVE METABOLIC PANEL - Abnormal; Notable for the following components:   Glucose, Bld 103 (*)    All other components within normal limits  GLUCOSE, CAPILLARY  TROPONIN I (HIGH SENSITIVITY)  TROPONIN I (HIGH SENSITIVITY)   Results for orders placed or performed during the hospital encounter of 04/24/23 (from the past 24 hour(s))  Glucose, capillary     Status: None   Collection Time: 04/24/23  6:14 PM  Result Value Ref Range   Glucose-Capillary 93 70 - 99 mg/dL   Comment 1 Notify RN   CBC     Status: Abnormal  Collection Time: 04/24/23  6:18 PM  Result Value Ref Range   WBC 6.5 4.0 - 10.5 K/uL   RBC 5.77 4.22 - 5.81 MIL/uL   Hemoglobin 17.1 (H) 13.0 - 17.0 g/dL   HCT 16.1 09.6 - 04.5 %   MCV 84.9 80.0 - 100.0 fL   MCH 29.6 26.0 - 34.0 pg   MCHC 34.9 30.0 - 36.0 g/dL   RDW 40.9 81.1 - 91.4 %   Platelets 246 150 - 400 K/uL   nRBC 0.0 0.0 - 0.2 %  Troponin I (High Sensitivity)     Status: None   Collection Time: 04/24/23  6:18 PM  Result Value Ref Range   Troponin I (High Sensitivity) 3 <18 ng/L  Comprehensive metabolic panel     Status: Abnormal   Collection Time: 04/24/23  6:18 PM  Result Value Ref Range   Sodium 138 135 - 145 mmol/L   Potassium 3.8 3.5 - 5.1 mmol/L   Chloride 101 98 - 111 mmol/L   CO2 25 22 - 32 mmol/L   Glucose, Bld 103 (H) 70 - 99 mg/dL   BUN 10 6 - 20 mg/dL   Creatinine, Ser 7.82 0.61 - 1.24 mg/dL   Calcium 9.1 8.9 - 95.6  mg/dL   Total Protein 7.4 6.5 - 8.1 g/dL   Albumin 4.3 3.5 - 5.0 g/dL   AST 21 15 - 41 U/L   ALT 27 0 - 44 U/L   Alkaline Phosphatase 59 38 - 126 U/L   Total Bilirubin 0.9 0.3 - 1.2 mg/dL   GFR, Estimated >21 >30 mL/min   Anion gap 12 5 - 15  Troponin I (High Sensitivity)     Status: None   Collection Time: 04/24/23  8:33 PM  Result Value Ref Range   Troponin I (High Sensitivity) 3 <18 ng/L     EKG EKG Interpretation Date/Time:  Tuesday April 24 2023 18:10:03 EDT Ventricular Rate:  84 PR Interval:  121 QRS Duration:  98 QT Interval:  376 QTC Calculation: 445 R Axis:   86  Text Interpretation: Sinus rhythm Consider right ventricular hypertrophy ST elev, probable normal early repol pattern No significant change since last tracing Confirmed by Benjiman Core [phone]) on 04/24/2023 9:14:31 PM  Radiology DG Chest 2 View  Result Date: 04/24/2023 CLINICAL DATA:  Chest pain. Right lower chest pain. Lightheadedness began in our ago. EXAM: CHEST - 2 VIEW COMPARISON:  12/25/2022 FINDINGS: Postoperative changes in the mediastinum. Heart size and pulmonary vascularity are normal. Lungs are clear. No pleural effusions. No pneumothorax. Mediastinal contours appear intact. IMPRESSION: No active cardiopulmonary disease. Electronically Signed   By: Burman Nieves M.D.   On: 04/24/2023 19:08    Procedures Procedures    Medications Ordered in ED Medications - No data to display  ED Course/ Medical Decision Making/ A&P                                 Medical Decision Making Amount and/or Complexity of Data Reviewed External Data Reviewed: notes.    Details: Seen by cardiology one year ago (05/11/22). Per notes during that visit, having anxiety and difficulty sleeping. Patient reports current symptoms are very different than this previous evaluation Labs: ordered.    Details: Labs unremarkable on my review Radiology: ordered.    Details: CXR clear on my review; c/w radiology  interpretation ECG/medicine tests: ordered.    Details: Unchanged when compared to  previous. Discussion of management or test interpretation with external provider(s): No explanation for severe fatigue onset earlier this afternoon. Discussed rest, relaxation. F/U cardiology given significant cardiac history (transposition of the great vessels, s/p surgical repair as an infant). Patient with significant history of reflux just started prilosec, with no focused evaluation previously. Refer to gI.            Final Clinical Impression(s) / ED Diagnoses Final diagnoses:  Other fatigue    Rx / DC Orders ED Discharge Orders     None         Danne Harbor 04/24/23 2157    Benjiman Core, MD 04/25/23 [phone]

## 2023-05-23 ENCOUNTER — Emergency Department (HOSPITAL_BASED_OUTPATIENT_CLINIC_OR_DEPARTMENT_OTHER)
Admission: EM | Admit: 2023-05-23 | Discharge: 2023-05-23 | Disposition: A | Discharge status: Home / Self Care | Payer: Medicaid Other | Attending: Emergency Medicine | Admitting: Emergency Medicine

## 2023-05-23 ENCOUNTER — Other Ambulatory Visit: Payer: Self-pay

## 2023-05-23 ENCOUNTER — Encounter (HOSPITAL_BASED_OUTPATIENT_CLINIC_OR_DEPARTMENT_OTHER): Payer: Self-pay

## 2023-05-23 DIAGNOSIS — H6592 Unspecified nonsuppurative otitis media, left ear: Secondary | ICD-10-CM | POA: Insufficient documentation

## 2023-05-23 DIAGNOSIS — R42 Dizziness and giddiness: Secondary | ICD-10-CM | POA: Insufficient documentation

## 2023-05-23 LAB — COMPREHENSIVE METABOLIC PANEL
ALT: 24 U/L (ref 0–44)
AST: 20 U/L (ref 15–41)
Albumin: 4.4 g/dL (ref 3.5–5.0)
Alkaline Phosphatase: 64 U/L (ref 38–126)
Anion gap: 7 (ref 5–15)
BUN: 13 mg/dL (ref 6–20)
CO2: 28 mmol/L (ref 22–32)
Calcium: 9.1 mg/dL (ref 8.9–10.3)
Chloride: 106 mmol/L (ref 98–111)
Creatinine, Ser: 1.05 mg/dL (ref 0.61–1.24)
GFR, Estimated: >60 mL/min (ref >60)
Glucose, Bld: 92 mg/dL (ref 70–99)
Potassium: 3.9 mmol/L (ref 3.5–5.1)
Sodium: 141 mmol/L (ref 135–145)
Total Bilirubin: 0.8 mg/dL (ref 0.3–1.2)
Total Protein: 7.3 g/dL (ref 6.5–8.1)

## 2023-05-23 LAB — CBC WITH DIFFERENTIAL/PLATELET
Abs Immature Granulocytes: 0.02 10*3/uL (ref 0.00–0.07)
Basophils Absolute: 0.1 10*3/uL (ref 0.0–0.1)
Basophils Relative: 1 %
Eosinophils Absolute: 0.2 10*3/uL (ref 0.0–0.5)
Eosinophils Relative: 4 %
HCT: 50.3 % (ref 39.0–52.0)
Hemoglobin: 17.5 g/dL — ABNORMAL HIGH (ref 13.0–17.0)
Immature Granulocytes: 0 %
Lymphocytes Relative: 26 %
Lymphs Abs: 1.7 10*3/uL (ref 0.7–4.0)
MCH: 29.5 pg (ref 26.0–34.0)
MCHC: 34.8 g/dL (ref 30.0–36.0)
MCV: 84.7 fL (ref 80.0–100.0)
Monocytes Absolute: 0.6 10*3/uL (ref 0.1–1.0)
Monocytes Relative: 9 %
Neutro Abs: 3.9 10*3/uL (ref 1.7–7.7)
Neutrophils Relative %: 60 %
Platelets: 273 10*3/uL (ref 150–400)
RBC: 5.94 MIL/uL — ABNORMAL HIGH (ref 4.22–5.81)
RDW: 11.9 % (ref 11.5–15.5)
WBC: 6.5 10*3/uL (ref 4.0–10.5)
nRBC: 0 % (ref 0.0–0.2)

## 2023-05-23 LAB — CBG MONITORING, ED: Glucose-Capillary: 109 mg/dL — ABNORMAL HIGH (ref 70–99)

## 2023-05-23 LAB — MAGNESIUM: Magnesium: 2.1 mg/dL (ref 1.7–2.4)

## 2023-05-23 LAB — TROPONIN I (HIGH SENSITIVITY): Troponin I (High Sensitivity): 3 ng/L (ref <18)

## 2023-05-23 MED ORDER — FAMOTIDINE 20 MG PO TABS: 20.0000 mg | ORAL_TABLET | Freq: Two times a day (BID) | ORAL | 0 refills | Status: AC

## 2023-05-23 MED ORDER — MECLIZINE HCL 25 MG PO TABS
25.0000 mg | ORAL_TABLET | Freq: Once | ORAL | Status: AC
  Administered 2023-05-23: 25 mg via ORAL
  Filled 2023-05-23: qty 1

## 2023-05-23 MED ORDER — MECLIZINE HCL 25 MG PO TABS: 25.0000 mg | ORAL_TABLET | Freq: Three times a day (TID) | ORAL | 0 refills | Status: AC | PRN

## 2023-05-23 MED ORDER — AMOXICILLIN 500 MG PO CAPS: 500.0000 mg | ORAL_CAPSULE | Freq: Three times a day (TID) | ORAL | 0 refills | Status: DC

## 2023-05-23 MED ORDER — AMOXICILLIN 500 MG PO CAPS: 500.0000 mg | ORAL_CAPSULE | Freq: Two times a day (BID) | ORAL | 0 refills | Status: AC

## 2023-05-23 MED ORDER — AMOXICILLIN 500 MG PO CAPS
500.0000 mg | ORAL_CAPSULE | Freq: Once | ORAL | Status: AC
  Administered 2023-05-23: 500 mg via ORAL
  Filled 2023-05-23: qty 1

## 2023-05-23 NOTE — Discharge Instructions (Addendum)
Thank you for coming to [facility] Emergency Department. You were seen for dizziness and fullness on the left side of the head. We did an exam, labs, and these showed a left otitis media (ear infection). Please take amoxicillin 500 mg twice per day for 10 days for  the ear infection and tylenol 1,000 mg every 8 hours for headache. You can also take meclizine 25 mg up to three times per day for dizziness. You can also take famotidine 20 mg twice per day for reflux. Please follow up with a GI doctor as originally instructed.  Please follow up with your primary care provider within 1 week.   Do not hesitate to return to the ED or call 911 if you experience: -Worsening symptoms -Lightheadedness, passing out -Fevers/chills -Anything else that concerns you

## 2023-05-23 NOTE — ED Triage Notes (Addendum)
Pt states he has been experiencing dizziness/lightheadedness x 5 days Also fatigue States the left side of his head "feels full" Pt was seen at [facility] yesterday and labs were drawn

## 2023-05-23 NOTE — ED Provider Notes (Signed)
[place] [facility] Provider Note   CSN: [id number] Arrival date & time: 05/23/23  1603     History  Chief Complaint  Patient presents with   Dizziness    Steve Henson is a 22 y.o. male with PMH as listed below who presents with his mother at bedside for dizziness. Pt states he has been experiencing dizziness x 5 days. Pt has TGA s/p arterial switch. Also fatigued but he states that that has improved. States the left side of his head "feels full."  He denies any room spinning sensation or presyncopal feeling.  He also had some pain in his left ear yesterday and wondered if he had TMJ or an ear infection.  He states he has not had any drainage from the left ear.  He thinks he potentially has had some decreased hearing on the left side as well. Pt was seen at [facility] yesterday and labs were drawn which were fine.  He denies any fevers chills, visual changes, strokelike symptoms, shortness of breath, cough, abdominal pain, nausea vomiting diarrhea constipation, melena/hematochezia, lower extremity edema, recent trauma, recent illnesses, sick contacts.  He states he has chest pain but he always has chest pain because of reflux.  He states he does not take anything for reflux and has been working on diet and lifestyle modification instead.  Per chart review patient has been seen twice in the last month for fatigue, once at the ED and once at urgent care.  He had no infectious symptoms, chest or abdominal pain at that time. Was referred to GI for his reflux and also referred to psychiatry for possible depression symptoms.   Past Medical History:  Diagnosis Date   GERD (gastroesophageal reflux disease)    Transposition great arteries        Home Medications Prior to Admission medications   Medication Sig Start Date End Date Taking? Authorizing Provider  famotidine (PEPCID) 20 MG tablet Take 1 tablet (20 mg total) by mouth 2 (two) times daily. 05/23/23  Yes  Loetta Rough, MD  meclizine (ANTIVERT) 25 MG tablet Take 1 tablet (25 mg total) by mouth 3 (three) times daily as needed for dizziness. 05/23/23  Yes Loetta Rough, MD  amoxicillin (AMOXIL) 500 MG capsule Take 1 capsule (500 mg total) by mouth 2 (two) times daily for 10 days. 05/24/23 06/03/23  Loetta Rough, MD  potassium chloride SA (KLOR-CON) 20 MEQ tablet Take 1 tablet (20 mEq total) by mouth 2 (two) times daily. 05/07/21   Glynn Octave, MD      Allergies    Patient has no known allergies.    Review of Systems   Review of Systems A 10 point review of systems was performed and is negative unless otherwise reported in HPI.  Physical Exam Updated Vital Signs BP 118/77   Pulse 76   Temp 98.8 F (37.1 C)   Resp 17   Ht 6' (1.829 m)   Wt 97.5 kg   SpO2 98%   BMI 29.15 kg/m  Physical Exam General: Normal appearing male, lying in bed.  HEENT: PERRLA, EOMI, no nystagmus, sclera anicteric, MMM, clear oropharynx, trachea midline.  Right TM normal-appearing with normal cone of light.  Left TM with effusion and "glue ear" appearance of left middle ear.  Normal bilateral pinnae, no tenderness on palpation or movement of the left external ear.  No erythema or tenderness outpatient of the left mastoid. Cardiology: RRR, no murmurs/rubs/gallops. BL radial  and DP pulses equal bilaterally.B, no wheezes, rhonchi, crackles.  Abd: Soft, non-tender, non-distended. No rebound tenderness or guarding.  GU: Deferred. MSK: No peripheral edema or signs of trauma. Extremities without deformity or TTP. No cyanosis or clubbing. Skin: warm, dry.  Neuro: A&Ox4, CNs II-XII grossly intact. MAEs. Sensation grossly intact.  Psych: Normal mood and affect.   ED Results / Procedures / Treatments   Labs (all labs ordered are listed, but only abnormal results are displayed) Labs Reviewed  CBC WITH DIFFERENTIAL/PLATELET - Abnormal; Notable for the following components:      Result Value   RBC 5.94 (*)     Hemoglobin 17.5 (*)    All other components within normal limits  CBG MONITORING, ED - Abnormal; Notable for the following components:   Glucose-Capillary 109 (*)    All other components within normal limits  COMPREHENSIVE METABOLIC PANEL  MAGNESIUM  TROPONIN I (HIGH SENSITIVITY)    EKG EKG Interpretation Date/Time:  Wednesday May 23 2023 17:57:06 EDT Ventricular Rate:  77 PR Interval:  160 QRS Duration:  98 QT Interval:  385 QTC Calculation: 436 R Axis:   75  Text Interpretation: Sinus rhythm Probable left atrial enlargement RSR' in V1 or V2, right VCD or RVH ST elev, probable normal early repol pattern Similar to prior EKGs Confirmed by Vivi Barrack [phone]) on 05/23/2023 6:49:17 PM  Radiology No results found.  Procedures Procedures    Medications Ordered in ED Medications  meclizine (ANTIVERT) tablet 25 mg (has no administration in time range)  amoxicillin (AMOXIL) capsule 500 mg (has no administration in time range)    ED Course/ Medical Decision Making/ A&P                          Medical Decision Making Amount and/or Complexity of Data Reviewed Labs: ordered. Decision-making details documented in ED Course.  Risk Prescription drug management.    MDM:    Patient presents with vague dizziness.  He states the fatigue sensation has actually improved since his last visit with urgent care.  He has no electrolyte derangements and his CBC is at baseline, his hemoglobin is mildly elevated but it is chronically so, consider possible volume contraction or dehydration though patient states he is eating and drinking normally.  He does not have any signs of ischemia or arrhythmia on his EKG.  Given his cardiac history, ordered a troponin which is also negative.  No hypo-/hyperglycemia.  No strokelike symptoms or nystagmus to indicate an intracerebral cause of his dizziness.  On physical exam and because of his ear fullness and pain I evaluated his TMs which do appear  to have an effusion on the left with a possible AOM.  This could explain the patient's symptoms.  He is given meclizine and amoxicillin here in the emergency department and is prescribed both medications.  He also reports that he is chronic reflux and does not take any medications but still with lifestyle modifications has been struggling with reflux daily.  Will prescribe Pepcid to take twice per day for 30 days and on a prior visit he was instructed to follow-up with GI and so I recommended that as well.  Patient is hemodynamically stable and extremely well-appearing, I do not believe he requires admission and I believe he is stable for discharge.  Clinical Course as of 05/23/23 1857  Wed May 23, 2023  1830 CBC with Differential(!) Stable from priors [HN]  1848 Troponin I (High Sensitivity):  3 Neg trop. Mg wnl, CMP wnl [HN]    Clinical Course User Index [HN] Loetta Rough, MD    Labs: I Ordered, and personally interpreted labs.  The pertinent results include: Those listed above  Additional history obtained from chart review, mother at bedside.  Social Determinants of Health:   Lives independently  Disposition: DC with discharge instructions and return precautions.  Instructed to follow-up with PCP within 1 week.  Patient states he does not have a PCP and I recommended he establish with 1 to make an appointment soon as possible.  Co morbidities that complicate the patient evaluation  Past Medical History:  Diagnosis Date   GERD (gastroesophageal reflux disease)    Transposition great arteries      Medicines Meds ordered this encounter  Medications   meclizine (ANTIVERT) tablet 25 mg   amoxicillin (AMOXIL) capsule 500 mg   meclizine (ANTIVERT) 25 MG tablet    Sig: Take 1 tablet (25 mg total) by mouth 3 (three) times daily as needed for dizziness.    Dispense:  30 tablet    Refill:  0   DISCONTD: amoxicillin (AMOXIL) 500 MG capsule    Sig: Take 1 capsule (500 mg total) by  mouth 3 (three) times daily for 10 days.    Dispense:  30 capsule    Refill:  0   amoxicillin (AMOXIL) 500 MG capsule    Sig: Take 1 capsule (500 mg total) by mouth 2 (two) times daily for 10 days.    Dispense:  20 capsule    Refill:  0   famotidine (PEPCID) 20 MG tablet    Sig: Take 1 tablet (20 mg total) by mouth 2 (two) times daily.    Dispense:  30 tablet    Refill:  0    I have reviewed the patients home medicines and have made adjustments as needed  Problem List / ED Course: Problem List Items Addressed This Visit   None Visit Diagnoses     Dizziness    -  Primary   Left otitis media with effusion       Relevant Medications   amoxicillin (AMOXIL) capsule 500 mg (Start on 05/23/2023  7:00 PM)   amoxicillin (AMOXIL) 500 MG capsule (Start on 05/24/2023)                   This note was created using dictation software, which may contain spelling or grammatical errors.    Loetta Rough, MD 05/23/23 1902

## 2023-05-26 ENCOUNTER — Other Ambulatory Visit: Payer: Self-pay

## 2023-05-26 ENCOUNTER — Emergency Department (HOSPITAL_BASED_OUTPATIENT_CLINIC_OR_DEPARTMENT_OTHER)
Admission: EM | Admit: 2023-05-26 | Discharge: 2023-05-26 | Disposition: A | Discharge status: Home / Self Care | Payer: Medicaid Other | Attending: Emergency Medicine | Admitting: Emergency Medicine

## 2023-05-26 ENCOUNTER — Emergency Department (HOSPITAL_BASED_OUTPATIENT_CLINIC_OR_DEPARTMENT_OTHER): Payer: Medicaid Other

## 2023-05-26 ENCOUNTER — Encounter (HOSPITAL_BASED_OUTPATIENT_CLINIC_OR_DEPARTMENT_OTHER): Payer: Self-pay

## 2023-05-26 DIAGNOSIS — H6692 Otitis media, unspecified, left ear: Secondary | ICD-10-CM | POA: Diagnosis not present

## 2023-05-26 DIAGNOSIS — R42 Dizziness and giddiness: Secondary | ICD-10-CM | POA: Diagnosis present

## 2023-05-26 DIAGNOSIS — R000 Tachycardia, unspecified: Secondary | ICD-10-CM | POA: Insufficient documentation

## 2023-05-26 DIAGNOSIS — H6690 Otitis media, unspecified, unspecified ear: Secondary | ICD-10-CM

## 2023-05-26 LAB — LIPASE, BLOOD: Lipase: 34 U/L (ref 11–51)

## 2023-05-26 LAB — CBG MONITORING, ED: Glucose-Capillary: 128 mg/dL — ABNORMAL HIGH (ref 70–99)

## 2023-05-26 LAB — CBC
HCT: 51.4 % (ref 39.0–52.0)
Hemoglobin: 18.1 g/dL — ABNORMAL HIGH (ref 13.0–17.0)
MCH: 29.8 pg (ref 26.0–34.0)
MCHC: 35.2 g/dL (ref 30.0–36.0)
MCV: 84.5 fL (ref 80.0–100.0)
Platelets: 274 10*3/uL (ref 150–400)
RBC: 6.08 MIL/uL — ABNORMAL HIGH (ref 4.22–5.81)
RDW: 12.2 % (ref 11.5–15.5)
WBC: 6.2 10*3/uL (ref 4.0–10.5)
nRBC: 0 % (ref 0.0–0.2)

## 2023-05-26 LAB — COMPREHENSIVE METABOLIC PANEL
ALT: 24 U/L (ref 0–44)
AST: 18 U/L (ref 15–41)
Albumin: 4.6 g/dL (ref 3.5–5.0)
Alkaline Phosphatase: 62 U/L (ref 38–126)
Anion gap: 11 (ref 5–15)
BUN: 11 mg/dL (ref 6–20)
CO2: 25 mmol/L (ref 22–32)
Calcium: 9.5 mg/dL (ref 8.9–10.3)
Chloride: 103 mmol/L (ref 98–111)
Creatinine, Ser: 1.06 mg/dL (ref 0.61–1.24)
GFR, Estimated: >60 mL/min (ref >60)
Glucose, Bld: 100 mg/dL — ABNORMAL HIGH (ref 70–99)
Potassium: 4.4 mmol/L (ref 3.5–5.1)
Sodium: 139 mmol/L (ref 135–145)
Total Bilirubin: 0.8 mg/dL (ref 0.3–1.2)
Total Protein: 7.7 g/dL (ref 6.5–8.1)

## 2023-05-26 NOTE — Discharge Instructions (Addendum)
Please follow-up with a primary care provider I have attached your for you today.  Today your exam was reassuring and you still fluid behind the ear with suspected infection and so please continue take your amoxicillin.  In regards to your amoxicillin please use loperamide over-the-counter to help with your diarrhea and eat plenty of fluids and high-fiber diet.  For your dizziness you may try antihistamines like Claritin or Zyrtec or Benadryl over-the-counter.  If symptoms change or worsen please return to ER.

## 2023-05-26 NOTE — ED Triage Notes (Signed)
The patient is having dizziness for a week. He is having diarrhea. He denied fever, cough, chest pain or shortness of breath. No head injuries.

## 2023-05-26 NOTE — ED Provider Notes (Signed)
[place] [facility] Provider Note   CSN: [id number] Arrival date & time: 05/26/23  1320     History  Chief Complaint  Patient presents with   Dizziness   Diarrhea    Steve Henson is a 22 y.o. 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  Patient was seen 3 days ago and was prescribed amoxicillin to help with the suspected AOM as the previous provider found fluid behind the left ear.  Patient been taking amoxicillin however has since taken it has been having loose stools daily.  Patient to be able to eat and drink without issue denies any nausea vomiting, fevers, abdominal pain, hematochezia.  Patient notes he is still dizzy from his previous visit but unable to describe the room is spinning or feels unsteady.  Patient denies any new onset weakness, changes sensation/motor skills, chest pain or shortness of breath, vision changes.  Patient states that the left side of his head aches which is the side where they found the AOM earlier.    Home Medications Prior to Admission medications   Medication Sig Start Date End Date Taking? Authorizing Provider  amoxicillin (AMOXIL) 500 MG capsule Take 1 capsule (500 mg total) by mouth 2 (two) times daily for 10 days. 05/24/23 06/03/23  Loetta Rough, MD  famotidine (PEPCID) 20 MG tablet Take 1 tablet (20 mg total) by mouth 2 (two) times daily. 05/23/23   Loetta Rough, MD  meclizine (ANTIVERT) 25 MG tablet Take 1 tablet (25 mg total) by mouth 3 (three) times daily as needed for dizziness. 05/23/23   Loetta Rough, MD  potassium chloride SA (KLOR-CON) 20 MEQ tablet Take 1 tablet (20 mEq total) by mouth 2 (two) times daily. 05/07/21   Glynn Octave, MD      Allergies    Patient has no known allergies.    Review of Systems   Review of Systems  Gastrointestinal:  Positive for diarrhea.  Neurological:  Positive  for dizziness.    Physical Exam Updated Vital Signs BP 125/79 (BP Location: Left Arm)   Pulse 97   Temp 98.6 F (37 C) (Oral)   Resp 18   Ht 6' (1.829 m)   Wt 97 kg   SpO2 99%   BMI 29.00 kg/m  Physical Exam Vitals reviewed.  Constitutional:      General: He is not in acute distress. HENT:     Head: Normocephalic and atraumatic.  Eyes:     Extraocular Movements: Extraocular movements intact.     Conjunctiva/sclera: Conjunctivae normal.     Pupils: Pupils are equal, round, and reactive to light.     Comments: No nystagmus noted  Cardiovascular:     Rate and Rhythm: Normal rate and regular rhythm.     Pulses: Normal pulses.     Heart sounds: Normal heart sounds.     Comments: 2+ bilateral radial/dorsalis pedis pulses with regular rate Pulmonary:     Effort: Pulmonary effort is normal. No respiratory distress.     Breath sounds: Normal breath sounds.  Abdominal:     Palpations: Abdomen is soft.     Tenderness: There is no abdominal tenderness. There is no guarding or rebound.  Musculoskeletal:        General: Normal range of motion.     Cervical back: Normal range of motion and neck supple.     Comments:  5 out of 5 bilateral grip/leg extension strength  Skin:    General: Skin is warm and dry.     Capillary Refill: Capillary refill takes less than 2 seconds.  Neurological:     General: No focal deficit present.     Mental Status: He is alert and oriented to person, place, and time.     Sensory: Sensation is intact.     Motor: Motor function is intact.     Coordination: Coordination is intact.     Gait: Gait is intact.     Comments: Sensation intact in all 4 limbs Cranial nerves III through XII intact Vision grossly intact  Psychiatric:        Mood and Affect: Mood normal.     ED Results / Procedures / Treatments   Labs (all labs ordered are listed, but only abnormal results are displayed) Labs Reviewed  CBC - Abnormal; Notable for the following components:       Result Value   RBC 6.08 (*)    Hemoglobin 18.1 (*)    All other components within normal limits  COMPREHENSIVE METABOLIC PANEL - Abnormal; Notable for the following components:   Glucose, Bld 100 (*)    All other components within normal limits  CBG MONITORING, ED - Abnormal; Notable for the following components:   Glucose-Capillary 128 (*)    All other components within normal limits  C DIFFICILE QUICK SCREEN W PCR REFLEX    LIPASE, BLOOD  URINALYSIS, ROUTINE W REFLEX MICROSCOPIC    EKG EKG Interpretation Date/Time:  Saturday May 26 2023 13:38:33 EDT Ventricular Rate:  116 PR Interval:  123 QRS Duration:  97 QT Interval:  327 QTC Calculation: 455 R Axis:   97  Text Interpretation: Sinus tachycardia Probable left atrial enlargement Consider right ventricular hypertrophy ST elev, probable normal early repol pattern Similar to Sep 25th tracing now with tachycardia Confirmed by Alona Bene [phone]) on 05/26/2023 1:43:40 PM  Radiology CT Head Wo Contrast  Result Date: 05/26/2023 CLINICAL DATA:  Provided history: Dizziness.  Headache. EXAM: CT HEAD WITHOUT CONTRAST TECHNIQUE: Contiguous axial images were obtained from the base of the skull through the vertex without intravenous contrast. RADIATION DOSE REDUCTION: This exam was performed according to the departmental dose-optimization program which includes automated exposure control, adjustment of the mA and/or kV according to patient size and/or use of iterative reconstruction technique. COMPARISON:  Head CT 05/20/2021. FINDINGS: Brain: Cerebral volume is normal. There is no acute intracranial hemorrhage. No demarcated cortical infarct. No extra-axial fluid collection. No evidence of an intracranial mass. No midline shift. Vascular: No hyperdense vessel. Skull: No calvarial fracture or aggressive osseous lesion. Sinuses/Orbits: No mass or acute finding within the imaged orbits. No significant paranasal sinus disease at the imaged  levels. IMPRESSION: No evidence of an acute intracranial abnormality. Electronically Signed   By: Jackey Loge D.O.   On: 05/26/2023 17:07    Procedures Procedures    Medications Ordered in ED Medications - No data to display  ED Course/ Medical Decision Making/ A&P                                 Medical Decision Making Amount and/or Complexity of Data Reviewed Labs: ordered. Radiology: ordered.   Alvy Bimler 22 y.o. presented today for dizziness, diarrhea. Working DDx that I considered at this time includes, but not limited to, CVA/TIA, arrhythmia, CNS lesion, BPPV, vestibular labyrinthitis, Meniere's,  Ramsey-Hunt, polypharmacy, orthostatic hypotension, electrolyte abnormalities, medication side effects, C. difficile, AOM.  R/o DDx: CVA/TIA, arrhythmia, CNS lesion, BPPV, vestibular labyrinthitis, Meniere's, Ramsey-Hunt, polypharmacy, orthostatic hypotension, electrolyte abnormalities: These are considered less likely due to history of present illness, physical exam, lab/imaging findings  Review of prior external notes: 05/23/2023 ED  Unique Tests and My Interpretation:  EKG: Sinus tachycardia 116, similar to previous reading denies tachycardia CBC: Unremarkable CMP: Unremarkable CT Head w/o Contrast: No acute intracranial changes Lipase: Negative CBG: 128  Discussion with Independent Historian: None  Discussion of Management of Tests: None  Risk: Low: based on diagnostic testing/clinical impression and treatment plan  Risk Stratification Score: none  Plan: On exam patient was no acute distress with stable vitals.  EKG shows that patient was tachycardic however when I evaluate the patient patient was no longer tachycardic and was not complaining of any chest pain or shortness of breath and so chest pain labs were not ordered.  Suspect the tachycardia has resolved due to patient appearing more comfortable in the room plus patient endorses that he is anxious as well due  to the dizziness which could be contributing to his tachycardia earlier.  Patient's neuroexam was reassuring along with his physical exam however was able to find the fluid behind his left ear that the previous provider found.  High suspicion that patient's dizziness is related to the AOM as he states his head feels "full "patient has not tried any antihistamines at this time and I suspect this may improve patient's symptoms.  I spoke with the patient and given chronicity of symptoms we have shared decision making and agreed to proceed with a CT scan to rule out any intracranial pathology even in the setting of a normal neuroexam.  Patient's labs in triage are reassuring however will put in for C. difficile as patient started having diarrhea after starting amoxicillin.  Patient CT was negative.  Recheck patient was still resting comfortably and appearing neurologically intact.  Patient states he cannot give a stool sample at this time and states he is okay with being discharged without providing a stool sample.  Patient denied any dysuria and states he is did not think a urine sample as this at this time either which is reasonable.  I encouraged patient to follow-up with the primary care provider will attach in his discharge papers and take antihistamines over-the-counter.  In terms of patient's diarrhea I encouraged take the amoxicillin for suspected AOM but use loperamide and eat a high-fiber diet and remain hydrated in the meantime and to monitor symptoms.  Patient was given return precautions. Patient stable for discharge at this time.  Patient verbalized understanding of plan.  This chart was dictated using voice recognition software.  Despite best efforts to proofread,  errors can occur which can change the documentation meaning.         Final Clinical Impression(s) / ED Diagnoses Final diagnoses:  Acute otitis media, unspecified otitis media type  Dizziness    Rx / DC Orders ED Discharge  Orders     None         Remi Deter 05/26/23 1734    Charlynne Pander, MD 05/26/23 [phone]

## 2023-05-27 ENCOUNTER — Encounter (HOSPITAL_BASED_OUTPATIENT_CLINIC_OR_DEPARTMENT_OTHER): Payer: Self-pay | Admitting: *Deleted

## 2023-05-27 ENCOUNTER — Emergency Department (HOSPITAL_BASED_OUTPATIENT_CLINIC_OR_DEPARTMENT_OTHER)
Admission: EM | Admit: 2023-05-27 | Discharge: 2023-05-27 | Disposition: A | Discharge status: Home / Self Care | Payer: Medicaid Other | Attending: Emergency Medicine | Admitting: Emergency Medicine

## 2023-05-27 ENCOUNTER — Emergency Department (HOSPITAL_BASED_OUTPATIENT_CLINIC_OR_DEPARTMENT_OTHER): Payer: Medicaid Other

## 2023-05-27 ENCOUNTER — Other Ambulatory Visit: Payer: Self-pay

## 2023-05-27 DIAGNOSIS — R0789 Other chest pain: Secondary | ICD-10-CM | POA: Diagnosis present

## 2023-05-27 LAB — CBC
HCT: 48.5 % (ref 39.0–52.0)
Hemoglobin: 16.9 g/dL (ref 13.0–17.0)
MCH: 29.5 pg (ref 26.0–34.0)
MCHC: 34.8 g/dL (ref 30.0–36.0)
MCV: 84.8 fL (ref 80.0–100.0)
Platelets: 265 10*3/uL (ref 150–400)
RBC: 5.72 MIL/uL (ref 4.22–5.81)
RDW: 12.2 % (ref 11.5–15.5)
WBC: 6.1 10*3/uL (ref 4.0–10.5)
nRBC: 0 % (ref 0.0–0.2)

## 2023-05-27 LAB — TROPONIN I (HIGH SENSITIVITY): Troponin I (High Sensitivity): 3 ng/L (ref <18)

## 2023-05-27 LAB — BASIC METABOLIC PANEL
Anion gap: 11 (ref 5–15)
BUN: 13 mg/dL (ref 6–20)
CO2: 25 mmol/L (ref 22–32)
Calcium: 9.3 mg/dL (ref 8.9–10.3)
Chloride: 105 mmol/L (ref 98–111)
Creatinine, Ser: 1.11 mg/dL (ref 0.61–1.24)
GFR, Estimated: >60 mL/min (ref >60)
Glucose, Bld: 90 mg/dL (ref 70–99)
Potassium: 4 mmol/L (ref 3.5–5.1)
Sodium: 141 mmol/L (ref 135–145)

## 2023-05-27 NOTE — ED Provider Notes (Signed)
[place] EMERGENCY DEPARTMENT AT [facility] [place] Provider Note   CSN: [id number] Arrival date & time: 05/27/23  2050     History  No chief complaint on file.   Steve Henson is a 22 y.o. male.  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  Patient has had a couple of ED visits recently for dizziness, fatigue.  He was started on amoxicillin for possible otitis media.  He was prescribed meclizine for dizziness which he has stopped taking because it was not helping.  He was seen yesterday for dizziness.  He had reassuring exam and workup.  He reports that the symptoms have gradually improved over the day or so.  However, his chest tightness has been ongoing for about 1 day.  I did not resolve with medications that he would typically take for reflux.  He denies shortness of breath.  No radiation of symptoms.  No vomiting or diaphoresis.  Symptoms are nonexertional.  No lower extremity swelling or history of blood clots.       Home Medications Prior to Admission medications   Medication Sig Start Date End Date Taking? Authorizing Provider  amoxicillin (AMOXIL) 500 MG capsule Take 1 capsule (500 mg total) by mouth 2 (two) times daily for 10 days. 05/24/23 06/03/23  Loetta Rough, MD  famotidine (PEPCID) 20 MG tablet Take 1 tablet (20 mg total) by mouth 2 (two) times daily. 05/23/23   Loetta Rough, MD  meclizine (ANTIVERT) 25 MG tablet Take 1 tablet (25 mg total) by mouth 3 (three) times daily as needed for dizziness. 05/23/23   Loetta Rough, MD  potassium chloride SA (KLOR-CON) 20 MEQ tablet Take 1 tablet (20 mEq total) by mouth 2 (two) times daily. 05/07/21   Glynn Octave, MD      Allergies    Patient has no known allergies.    Review of Systems   Review of Systems  Physical Exam Updated Vital Signs BP 97/60 (BP Location: Right Arm)   Pulse 81   Temp 98  F (36.7 C) (Oral)   Resp 18   Ht 6' (1.829 m)   Wt 97 kg   SpO2 98%   BMI 29.00 kg/m  Physical Exam Vitals and nursing note reviewed.  Constitutional:      Appearance: He is well-developed. He is not diaphoretic.  HENT:     Head: Normocephalic and atraumatic.     Mouth/Throat:     Mouth: Mucous membranes are not dry.  Eyes:     Conjunctiva/sclera: Conjunctivae normal.  Neck:     Vascular: Normal carotid pulses. No carotid bruit or JVD.     Trachea: Trachea normal. No tracheal deviation.  Cardiovascular:     Rate and Rhythm: Normal rate and regular rhythm.     Pulses: No decreased pulses.          Radial pulses are 2+ on the right side and 2+ on the left side.     Heart sounds: Normal heart sounds, S1 normal and S2 normal. Heart sounds not distant. No murmur heard. Pulmonary:     Effort: Pulmonary effort is normal. No respiratory distress.     Breath sounds: Normal breath sounds. No wheezing.  Chest:     Chest wall: No tenderness.  Abdominal:     General: Bowel sounds are normal.     Palpations: Abdomen is soft.  Tenderness: There is no abdominal tenderness. There is no guarding or rebound.  Musculoskeletal:     Cervical back: Normal range of motion and neck supple. No muscular tenderness.     Right lower leg: No edema.     Left lower leg: No edema.  Skin:    General: Skin is warm and dry.     Coloration: Skin is not pale.  Neurological:     Mental Status: He is alert. Mental status is at baseline.  Psychiatric:        Mood and Affect: Mood normal.     ED Results / Procedures / Treatments   Labs (all labs ordered are listed, but only abnormal results are displayed) Labs Reviewed  BASIC METABOLIC PANEL  CBC  TROPONIN I (HIGH SENSITIVITY)    EKG EKG Interpretation Date/Time:  Sunday May 27 2023 21:10:05 EDT Ventricular Rate:  91 PR Interval:  135 QRS Duration:  98 QT Interval:  361 QTC Calculation: 445 R Axis:   91  Text Interpretation: Sinus  rhythm Consider right ventricular hypertrophy ST elev, probable normal early repol pattern No significant change since last tracing Confirmed by Linwood Dibbles [phone]) on 05/27/2023 9:13:24 PM  Radiology CT Head Wo Contrast  Result Date: 05/26/2023 CLINICAL DATA:  Provided history: Dizziness.  Headache. EXAM: CT HEAD WITHOUT CONTRAST TECHNIQUE: Contiguous axial images were obtained from the base of the skull through the vertex without intravenous contrast. RADIATION DOSE REDUCTION: This exam was performed according to the departmental dose-optimization program which includes automated exposure control, adjustment of the mA and/or kV according to patient size and/or use of iterative reconstruction technique. COMPARISON:  Head CT 05/20/2021. FINDINGS: Brain: Cerebral volume is normal. There is no acute intracranial hemorrhage. No demarcated cortical infarct. No extra-axial fluid collection. No evidence of an intracranial mass. No midline shift. Vascular: No hyperdense vessel. Skull: No calvarial fracture or aggressive osseous lesion. Sinuses/Orbits: No mass or acute finding within the imaged orbits. No significant paranasal sinus disease at the imaged levels. IMPRESSION: No evidence of an acute intracranial abnormality. Electronically Signed   By: Jackey Loge D.O.   On: 05/26/2023 17:07    Procedures Procedures    Medications Ordered in ED Medications - No data to display  ED Course/ Medical Decision Making/ A&P    Patient seen and examined. History obtained directly from patient.   Labs/EKG: Ordered CBC, BMP, troponin.  EKG personally reviewed and interpreted as above.  Imaging: Ordered chest x-ray.  Medications/Fluids: Ordered: None ordered.   Most recent vital signs reviewed and are as follows: BP 97/60 (BP Location: Right Arm)   Pulse 81   Temp 98 F (36.7 C) (Oral)   Resp 18   Ht 6' (1.829 m)   Wt 97 kg   SpO2 98%   BMI 29.00 kg/m   Initial impression: Nonspecific mid chest  tightness.  Reassuring vital signs.  10:28 PM Reassessment performed. Patient appears stable, still a bit anxious.  Blood pressures are little bit soft compared to most recent visits, but no lightheadedness with standing or associated tachycardia.  Labs personally reviewed and interpreted including: CBC unremarkable; BMP unremarkable; troponin normal.  Imaging personally visualized and interpreted including: Chest x-ray, agree negative, unchanged from previous.  Reviewed pertinent lab work and imaging with patient at bedside. Questions answered.  He states that he does not currently have a doctor but is looking into finding a psychiatrist for anxiety.  He acknowledges that he comes into the emergency department a lot for  chest symptoms and does state that he feels the frequency of visits has improved recently.  He seems overall reassured that his lab work looks okay today.  Most current vital signs reviewed and are as follows: BP 97/60 (BP Location: Right Arm)   Pulse 81   Temp 98 F (36.7 C) (Oral)   Resp 18   Ht 6' (1.829 m)   Wt 97 kg   SpO2 98%   BMI 29.00 kg/m   Plan: Discharge to home.   Prescriptions written for: None  Other home care instructions discussed: Continue home meds and symptom control.  Return and follow-up instructions: I encouraged patient to return to ED with severe chest pain, especially if the pain is crushing or pressure-like and spreads to the arms, back, neck, or jaw, or if they have associated sweating, vomiting, or shortness of breath with the pain, or significant pain with activity. We discussed that the evaluation here today indicates a low-risk of serious cause of chest pain, including heart trouble or a blood clot, but no evaluation is perfect and chest pain can evolve with time. The patient verbalized understanding and agreed.  I encouraged patient to follow-up with their provider when able for recheck.                                   Medical  Decision Making Amount and/or Complexity of Data Reviewed Labs: ordered. Radiology: ordered.   For this patient's complaint of chest pain, the following emergent conditions were considered on the differential diagnosis: acute coronary syndrome, pulmonary embolism, pneumothorax, myocarditis, pericardial tamponade, aortic dissection, thoracic aortic aneurysm complication, esophageal perforation.   Other causes were also considered including: gastroesophageal reflux disease, musculoskeletal pain including costochondritis, pneumonia/pleurisy, herpes zoster, pericarditis.  In regards to possibility of ACS, patient has atypical features of pain, non-ischemic and unchanged EKG and negative troponin(s).   In regards to possibility of PE, symptoms are atypical for PE and risk profile is low, making PE low likelihood.   The patient's vital signs, pertinent lab work and imaging were reviewed and interpreted as discussed in the ED course. Hospitalization was considered for further testing, treatments, or serial exams/observation. However as patient is well-appearing, has a stable exam, and reassuring studies today, I do not feel that they warrant admission at this time. This plan was discussed with the patient who verbalizes agreement and comfort with this plan and seems reliable and able to return to the Emergency Department with worsening or changing symptoms.          Final Clinical Impression(s) / ED Diagnoses Final diagnoses:  Atypical chest pain    Rx / DC Orders ED Discharge Orders     None         Desmond Dike 05/27/23 2230    Linwood Dibbles, MD 05/29/23 1236

## 2023-05-27 NOTE — ED Triage Notes (Signed)
Patient sates chest pressure started last night and constant - central chest. Denies N/V. Dd some burping  to release pressure. Ineffective Mild dizziness.

## 2023-05-27 NOTE — Discharge Instructions (Signed)
Please read and follow all provided instructions.  Your diagnoses today include:  1. Atypical chest pain     Tests performed today include: An EKG of your heart A chest x-ray Cardiac enzymes - a blood test for heart muscle damage Blood counts and electrolytes Vital signs. See below for your results today.   Medications prescribed:  None  Take any prescribed medications only as directed.  Follow-up instructions: Please follow-up with your primary care provider as soon as you can for further evaluation of your symptoms.   Return instructions:  SEEK IMMEDIATE MEDICAL ATTENTION IF: You have severe chest pain, especially if the pain is crushing or pressure-like and spreads to the arms, back, neck, or jaw, or if you have sweating, nausea or vomiting, or trouble with breathing. THIS IS AN EMERGENCY. Do not wait to see if the pain will go away. Get medical help at once. Call 911. DO NOT drive yourself to the hospital.  Your chest pain gets worse and does not go away after a few minutes of rest.  You have an attack of chest pain lasting longer than what you usually experience.  You have significant dizziness, if you pass out, or have trouble walking.  You have chest pain not typical of your usual pain for which you originally saw your caregiver.  You have any other emergent concerns regarding your health.  Additional Information: Chest pain comes from many different causes. Your caregiver has diagnosed you as having chest pain that is not specific for one problem, but does not require admission.  You are at low risk for an acute heart condition or other serious illness.   Your vital signs today were: BP 97/60 (BP Location: Right Arm)   Pulse 81   Temp 98 F (36.7 C) (Oral)   Resp 18   Ht 6' (1.829 m)   Wt 97 kg   SpO2 98%   BMI 29.00 kg/m  If your blood pressure (BP) was elevated above 135/85 this visit, please have this repeated by your doctor within one  month. --------------

## 2023-09-11 ENCOUNTER — Encounter (HOSPITAL_BASED_OUTPATIENT_CLINIC_OR_DEPARTMENT_OTHER): Payer: Self-pay

## 2023-09-11 ENCOUNTER — Other Ambulatory Visit: Payer: Self-pay

## 2023-09-11 ENCOUNTER — Emergency Department (HOSPITAL_BASED_OUTPATIENT_CLINIC_OR_DEPARTMENT_OTHER)
Admission: EM | Admit: 2023-09-11 | Discharge: 2023-09-11 | Disposition: A | Discharge status: Home / Self Care | Payer: Medicaid Other | Attending: Emergency Medicine | Admitting: Emergency Medicine

## 2023-09-11 DIAGNOSIS — X58XXXA Exposure to other specified factors, initial encounter: Secondary | ICD-10-CM | POA: Insufficient documentation

## 2023-09-11 DIAGNOSIS — S0501XA Injury of conjunctiva and corneal abrasion without foreign body, right eye, initial encounter: Secondary | ICD-10-CM | POA: Diagnosis present

## 2023-09-11 MED ORDER — FLUORESCEIN SODIUM 1 MG OP STRP
1.0000 | ORAL_STRIP | Freq: Once | OPHTHALMIC | Status: AC
  Administered 2023-09-11: 1 via OPHTHALMIC
  Filled 2023-09-11: qty 1

## 2023-09-11 MED ORDER — TETRACAINE HCL 0.5 % OP SOLN
2.0000 [drp] | Freq: Once | OPHTHALMIC | Status: AC
  Administered 2023-09-11: 2 [drp] via OPHTHALMIC
  Filled 2023-09-11: qty 4

## 2023-09-11 MED ORDER — ERYTHROMYCIN 5 MG/GM OP OINT: TOPICAL_OINTMENT | OPHTHALMIC | 0 refills | Status: AC

## 2023-09-11 NOTE — ED Triage Notes (Addendum)
 Pt with sharp pain to Lt temple 1 hour ago. Pt states it was intermittent and has now stopped. Pt states his RT eye is blurry now. No hx of migraines. Denies ETOH or drug use; denies HTN. Pt reports he took 2 vitamin gummies (mushroom and ceylon cinnamon). Ambulates without any issue. Pt reports that he had fluid in his LT ear and was seen for that recently as it caused dizziness, would like to get it checked out.

## 2023-09-11 NOTE — ED Provider Notes (Signed)
 [place] EMERGENCY DEPARTMENT AT [facility] [place] Provider Note   CSN: [id number] Arrival date & time: 09/11/23  0104     History  Chief Complaint  Patient presents with   Blurred Vision    RT eye    Steve Henson is a 23 y.o. male.  The history is provided by the patient and a parent.  Eye Problem Location:  Right eye Quality:  Aching (blurry) Timing:  Constant Progression:  Unchanged Chronicity:  New Context: not smoke exposure and not UV exposure   Relieved by:  Nothing Worsened by:  Nothing Ineffective treatments:  None tried Associated symptoms: blurred vision   Associated symptoms: no crusting, no discharge, no inflammation, no itching, no nausea, no numbness, no photophobia, no redness, no scotomas, no swelling, no vomiting and no weakness   Patient reports headache in the left temple area that went away on it's own.  Now notes right eye blurriness.       Home Medications Prior to Admission medications   Medication Sig Start Date End Date Taking? Authorizing Provider  erythromycin  ophthalmic ointment Place a 1/2 inch ribbon of ointment into the lower eyelid BID 09/11/23  Yes Anab Vivar, MD  famotidine  (PEPCID ) 20 MG tablet Take 1 tablet (20 mg total) by mouth 2 (two) times daily. 05/23/23   Franklyn Sid LOISE, MD  meclizine  (ANTIVERT ) 25 MG tablet Take 1 tablet (25 mg total) by mouth 3 (three) times daily as needed for dizziness. 05/23/23   Franklyn Sid LOISE, MD  potassium chloride  SA (KLOR-CON ) 20 MEQ tablet Take 1 tablet (20 mEq total) by mouth 2 (two) times daily. 05/07/21   Carita Senior, MD      Allergies    Patient has no known allergies.    Review of Systems   Review of Systems  Constitutional:  Negative for fever.  HENT:  Negative for facial swelling.   Eyes:  Positive for blurred vision. Negative for photophobia, discharge, redness and itching.  Gastrointestinal:  Negative for nausea and vomiting.  Musculoskeletal:  Negative for neck pain  and neck stiffness.  Neurological:  Negative for dizziness, seizures, syncope, facial asymmetry, speech difficulty, weakness, light-headedness and numbness.  All other systems reviewed and are negative.   Physical Exam Updated Vital Signs BP 126/84   Pulse 86   Temp 98.4 F (36.9 C)   Resp 16   Ht 6' (1.829 m)   Wt 102.1 kg   SpO2 100%   BMI 30.52 kg/m  Physical Exam Vitals and nursing note reviewed. Exam conducted with a chaperone present.  Constitutional:      General: He is not in acute distress.    Appearance: He is well-developed. He is not diaphoretic.  HENT:     Head: Normocephalic and atraumatic.     Left Ear: Tympanic membrane normal.     Nose: Nose normal.     Mouth/Throat:     Mouth: Mucous membranes are moist.     Pharynx: Oropharynx is clear. No oropharyngeal exudate.  Eyes:     Extraocular Movements: Extraocular movements intact.     Conjunctiva/sclera: Conjunctivae normal.     Right eye: No chemosis.    Pupils: Pupils are equal, round, and reactive to light.      Comments: No proptosis, disk margins sharp, intact cognition   Cardiovascular:     Rate and Rhythm: Normal rate and regular rhythm.  Pulmonary:     Effort: Pulmonary effort is normal.     Breath sounds:  Normal breath sounds. No wheezing or rales.  Abdominal:     General: Bowel sounds are normal.     Palpations: Abdomen is soft.     Tenderness: There is no abdominal tenderness. There is no guarding or rebound.  Musculoskeletal:        General: Normal range of motion.     Cervical back: Normal range of motion and neck supple.  Skin:    General: Skin is warm and dry.     Capillary Refill: Capillary refill takes less than 2 seconds.  Neurological:     General: No focal deficit present.     Mental Status: He is alert and oriented to person, place, and time.     Cranial Nerves: No cranial nerve deficit.     Gait: Gait normal.     Deep Tendon Reflexes: Reflexes normal.  Psychiatric:         Thought Content: Thought content normal.      Visual Acuity  Right Eye Distance: 20/25 Left Eye Distance: 20/25 Bilateral Distance: 20/20  Right Eye Near:   Left Eye Near:    Bilateral Near:     ED Results / Procedures / Treatments   Labs (all labs ordered are listed, but only abnormal results are displayed) Labs Reviewed - No data to display  EKG None  Radiology No results found.  Procedures Procedures    Medications Ordered in ED Medications  fluorescein  ophthalmic strip 1 strip (1 strip Right Eye Given 09/11/23 0151)  tetracaine  (PONTOCAINE) 0.5 % ophthalmic solution 2 drop (2 drops Right Eye Given 09/11/23 0151)    ED Course/ Medical Decision Making/ A&P                                 Medical Decision Making Patient with blurred vision and feeling in right eye   Amount and/or Complexity of Data Reviewed Independent Historian: parent    Details: See above  External Data Reviewed: notes.    Details: Previous notes reviewed   Risk Prescription drug management. Risk Details: Normal visual acuity.  Small corneal abrasion.  Disk margins sharp.  Will start antibiotic ointment and have referred to ophthalmology for ongoing care.  Disks are sharp and cranial nerves are intact.  Patient has also had recent normal CT head.  Stable for discharge with close follow up.  Have also advised getting a PMD for ongoing care.      Final Clinical Impression(s) / ED Diagnoses Final diagnoses:  Abrasion of right cornea, initial encounter   Return for intractable cough, coughing up blood, fevers > 100.4 unrelieved by medication, shortness of breath, intractable vomiting, chest pain, shortness of breath, weakness, numbness, changes in speech, facial asymmetry, abdominal pain, passing out, Inability to tolerate liquids or food, cough, altered mental status or any concerns. No signs of systemic illness or infection. The patient is nontoxic-appearing on exam and vital signs are within  normal limits.  I have reviewed the triage vital signs and the nursing notes. Pertinent labs & imaging results that were available during my care of the patient were reviewed by me and considered in my medical decision making (see chart for details). After history, exam, and medical workup I feel the patient has been appropriately medically screened and is safe for discharge home. Pertinent diagnoses were discussed with the patient. Patient was given return precautions.  Rx / DC Orders ED Discharge Orders  Ordered    erythromycin  ophthalmic ointment        09/11/23 0155              Alanee Ting, MD 09/11/23 0201

## 2024-01-09 ENCOUNTER — Other Ambulatory Visit: Payer: Self-pay

## 2024-01-09 ENCOUNTER — Encounter (HOSPITAL_BASED_OUTPATIENT_CLINIC_OR_DEPARTMENT_OTHER): Payer: Self-pay

## 2024-01-09 ENCOUNTER — Emergency Department (HOSPITAL_BASED_OUTPATIENT_CLINIC_OR_DEPARTMENT_OTHER)

## 2024-01-09 ENCOUNTER — Emergency Department (HOSPITAL_BASED_OUTPATIENT_CLINIC_OR_DEPARTMENT_OTHER)
Admission: EM | Admit: 2024-01-09 | Discharge: 2024-01-09 | Disposition: A | Discharge status: Home / Self Care | Attending: Emergency Medicine | Admitting: Emergency Medicine

## 2024-01-09 DIAGNOSIS — R202 Paresthesia of skin: Secondary | ICD-10-CM | POA: Diagnosis present

## 2024-01-09 DIAGNOSIS — R531 Weakness: Secondary | ICD-10-CM | POA: Diagnosis not present

## 2024-01-09 DIAGNOSIS — R930 Abnormal findings on diagnostic imaging of skull and head, not elsewhere classified: Secondary | ICD-10-CM | POA: Diagnosis not present

## 2024-01-09 DIAGNOSIS — R200 Anesthesia of skin: Secondary | ICD-10-CM | POA: Diagnosis not present

## 2024-01-09 LAB — COMPREHENSIVE METABOLIC PANEL WITH GFR
ALT: 25 U/L (ref 0–44)
AST: 20 U/L (ref 15–41)
Albumin: 5 g/dL (ref 3.5–5.0)
Alkaline Phosphatase: 72 U/L (ref 38–126)
Anion gap: 13 (ref 5–15)
BUN: 8 mg/dL (ref 6–20)
CO2: 26 mmol/L (ref 22–32)
Calcium: 9.7 mg/dL (ref 8.9–10.3)
Chloride: 102 mmol/L (ref 98–111)
Creatinine, Ser: 1.11 mg/dL (ref 0.61–1.24)
GFR, Estimated: >60 mL/min (ref >60)
Glucose, Bld: 102 mg/dL — ABNORMAL HIGH (ref 70–99)
Potassium: 3.8 mmol/L (ref 3.5–5.1)
Sodium: 141 mmol/L (ref 135–145)
Total Bilirubin: 0.8 mg/dL (ref 0.0–1.2)
Total Protein: 7.8 g/dL (ref 6.5–8.1)

## 2024-01-09 LAB — CBC
HCT: 50.3 % (ref 39.0–52.0)
Hemoglobin: 17.7 g/dL — ABNORMAL HIGH (ref 13.0–17.0)
MCH: 29.5 pg (ref 26.0–34.0)
MCHC: 35.2 g/dL (ref 30.0–36.0)
MCV: 84 fL (ref 80.0–100.0)
Platelets: 261 10*3/uL (ref 150–400)
RBC: 5.99 MIL/uL — ABNORMAL HIGH (ref 4.22–5.81)
RDW: 12.2 % (ref 11.5–15.5)
WBC: 9.5 10*3/uL (ref 4.0–10.5)
nRBC: 0 % (ref 0.0–0.2)

## 2024-01-09 LAB — URINALYSIS, ROUTINE W REFLEX MICROSCOPIC
Bilirubin Urine: NEGATIVE
Glucose, UA: NEGATIVE mg/dL
Hgb urine dipstick: NEGATIVE
Ketones, ur: 15 mg/dL — AB
Leukocytes,Ua: NEGATIVE
Nitrite: NEGATIVE
Protein, ur: NEGATIVE mg/dL
Specific Gravity, Urine: 1.025 (ref 1.005–1.030)
pH: 5.5 (ref 5.0–8.0)

## 2024-01-09 LAB — CK: Total CK: 137 U/L (ref 49–397)

## 2024-01-09 LAB — CBG MONITORING, ED: Glucose-Capillary: 101 mg/dL — ABNORMAL HIGH (ref 70–99)

## 2024-01-09 MED ORDER — LACTATED RINGERS IV BOLUS
1000.0000 mL | Freq: Once | INTRAVENOUS | Status: AC
  Administered 2024-01-09: 1000 mL via INTRAVENOUS

## 2024-01-09 MED ORDER — DIPHENHYDRAMINE HCL 50 MG/ML IJ SOLN
12.5000 mg | Freq: Once | INTRAMUSCULAR | Status: AC
  Administered 2024-01-09: 12.5 mg via INTRAVENOUS
  Filled 2024-01-09: qty 1

## 2024-01-09 MED ORDER — DIPHENHYDRAMINE HCL 50 MG/ML IJ SOLN
25.0000 mg | Freq: Once | INTRAMUSCULAR | Status: AC
  Administered 2024-01-09: 25 mg via INTRAVENOUS
  Filled 2024-01-09: qty 1

## 2024-01-09 MED ORDER — PROCHLORPERAZINE EDISYLATE 10 MG/2ML IJ SOLN
10.0000 mg | Freq: Once | INTRAMUSCULAR | Status: AC
  Administered 2024-01-09: 10 mg via INTRAVENOUS
  Filled 2024-01-09: qty 2

## 2024-01-09 MED ORDER — KETOROLAC TROMETHAMINE 15 MG/ML IJ SOLN
15.0000 mg | Freq: Once | INTRAMUSCULAR | Status: AC
  Administered 2024-01-09: 15 mg via INTRAVENOUS
  Filled 2024-01-09: qty 1

## 2024-01-09 NOTE — ED Provider Notes (Signed)
 [facility] Provider Note  CSN: [id number] Arrival date & time: 01/09/24 1407  Chief Complaint(s) Weakness  HPI Steve Henson is a 23 y.o. male with history of transposition's of great arteries, status post surgery as an infant presenting to the emergency department with episode of numbness and tingling.  Patient reports this morning, developed numbness and tingling in the left arm, left leg.  Also felt weak in the left arm and left leg.  Feels some clumsiness in the left arm.  No trouble speaking or swallowing.  No vision changes.  No other new symptoms.  He has been having some intermittent retro-orbital headaches over the past few weeks, but no photophobia, nausea or vomiting.  No chest pain, back pain, shortness of breath, abdominal pain.  No neck or back pain.   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 Authorizing Provider  erythromycin  ophthalmic ointment Place a 1/2 inch ribbon of ointment into the lower eyelid BID 09/11/23   Palumbo, April, MD  famotidine  (PEPCID ) 20 MG tablet Take 1 tablet (20 mg total) by mouth 2 (two) times daily. 05/23/23   Merdis Stalling, MD  meclizine  (ANTIVERT ) 25 MG tablet Take 1 tablet (25 mg total) by mouth 3 (three) times daily as needed for dizziness. 05/23/23   Merdis Stalling, MD  potassium chloride  SA (KLOR-CON ) 20 MEQ tablet Take 1 tablet (20 mEq total) by mouth 2 (two) times daily. 05/07/21   Earma Gloss, MD                                                                                                                                     Past Surgical History Past Surgical History:  Procedure Laterality Date   CARDIAC SURGERY     open heart surgery     TONSILLECTOMY     Family History History reviewed. No pertinent family history.  Social History Social History   Tobacco Use   Smoking status: Never    Passive exposure: Never   Smokeless tobacco: Never  Vaping Use   Vaping status: Never Used  Substance Use Topics   Alcohol use: No   Drug use: No   Allergies Patient has no known allergies.  Review of Systems Review of Systems  All other systems reviewed and are negative.   Physical Exam Vital Signs  I have reviewed the triage vital signs BP 138/74   Pulse 82   Temp 97.7 F (36.5 C) (Oral)  Resp 12   Wt 99.8 kg   SpO2 99%   BMI 29.84 kg/m  Physical Exam Vitals and nursing note reviewed.  Constitutional:      General: He is not in acute distress.    Appearance: Normal appearance.  HENT:     Mouth/Throat:     Mouth: Mucous membranes are moist.  Eyes:     Conjunctiva/sclera: Conjunctivae normal.  Cardiovascular:     Rate and Rhythm: Normal rate and regular rhythm.  Pulmonary:     Effort: Pulmonary effort is normal. No respiratory distress.     Breath sounds: Normal breath sounds.  Abdominal:     General: Abdomen is flat.     Palpations: Abdomen is soft.     Tenderness: There is no abdominal tenderness.  Musculoskeletal:     Right lower leg: No edema.     Left lower leg: No edema.  Skin:    General: Skin is warm and dry.     Capillary Refill: Capillary refill takes less than 2 seconds.  Neurological:     Mental Status: He is alert and oriented to person, place, and time. Mental status is at baseline.     Comments: Cranial nerves II through XII intact, strength 5 out of 5 in the bilateral upper and lower extremities, no sensory deficit to light touch, no dysmetria on finger-nose-finger testing,  ambulatory with steady gait.  Psychiatric:        Mood and Affect: Mood normal.        Behavior: Behavior normal.     ED Results and Treatments Labs (all labs ordered are listed, but only abnormal results are displayed) Labs Reviewed  COMPREHENSIVE METABOLIC PANEL WITH GFR - Abnormal; Notable for the following components:      Result Value   Glucose, Bld 102 (*)    All other components within normal limits  CBC - Abnormal; Notable for the following components:   RBC 5.99 (*)    Hemoglobin 17.7 (*)    All other components within normal limits  URINALYSIS, ROUTINE W REFLEX MICROSCOPIC - Abnormal; Notable for the following components:   Ketones, ur 15 (*)    All other components within normal limits  CBG MONITORING, ED - Abnormal; Notable for the following components:   Glucose-Capillary 101 (*)    All other components within normal limits  CK                                                                                                                          Radiology MR BRAIN WO CONTRAST Result Date: 01/09/2024 CLINICAL DATA:  Neuro deficit, concern for stroke. EXAM: MRI HEAD WITHOUT CONTRAST TECHNIQUE: Multiplanar, multiecho pulse sequences of the brain and surrounding structures were obtained without intravenous contrast. COMPARISON:  CT head 05/26/2023. FINDINGS: Brain: No acute infarct. No evidence of intracranial hemorrhage. White matter is unremarkable. No edema, mass effect, or midline shift. Posterior fossa is unremarkable. Normal appearance of midline structures. The basilar  cisterns are patent. No extra-axial fluid collections. Ventricles: Normal size and configuration of the ventricles. Vascular: Skull base flow voids are visualized. Skull and upper cervical spine: No focal abnormality. Sinuses/Orbits: Orbits are symmetric. Paranasal sinuses are clear. Other: Mastoid air cells are clear. IMPRESSION: No acute intracranial abnormality. Electronically Signed   By: Denny Flack M.D.   On: 01/09/2024 19:15    Pertinent labs & imaging results that were available during my care of the patient were reviewed by me and considered in my medical decision making (see MDM for details).  Medications Ordered in ED Medications  lactated ringers bolus 1,000 mL (0 mLs Intravenous Stopped 01/09/24 1926)  ketorolac (TORADOL) 15 MG/ML injection 15 mg (15 mg Intravenous Given 01/09/24 1801)  prochlorperazine (COMPAZINE) injection 10 mg (10 mg Intravenous Given 01/09/24 1807)  diphenhydrAMINE (BENADRYL) injection 12.5 mg (12.5 mg Intravenous Given 01/09/24 1802)                                                                                                                                     Procedures Procedures  (including critical care time)  Medical Decision Making / ED Course   MDM:  23 year old male presenting to the emergency department with numbness.  Patient overall well-appearing, physical examination with no focal abnormality.  Patient reports subjective clumsiness of the left hand but he has no dysmetria dysdiadochokinesia.  Has no strength deficit or sensory deficit.  Suspect symptoms possibly related to complex migraine given intermittent recent headaches, will give migraine cocktail.  Low concern for stroke but patient does have history of congenital heart disease and prior cardiac surgery which increases his risk.  Will check MRI without contrast.  Doubt other dangerous process such as intracranial tumor, bleeding.  Patient has no neck pain or neck injury to suggest cervical radiculopathy or spinal cord process as cause of his symptoms.  Will reassess.  If MRI negative anticipate discharge  Clinical Course as of 01/09/24 1926  Wed Jan 09, 2024  1924 MRI negative.  Patient reports his tingling sensation has improved.  Suspect likely complex migraine.  He did have a minor akathetic type reaction to the Compazine, offered additional Benadryl but he does not want to  take anymore.  Recommended he take Benadryl at home as needed. Will discharge patient to home. All questions answered. Patient comfortable with plan of discharge. Return precautions discussed with patient and specified on the after visit summary.  [WS]    Clinical Course User Index [WS] Mordecai Applebaum, MD     Additional history obtained: -Additional history obtained from ems -External records from outside source obtained and reviewed including: Chart review including previous notes, labs, imaging, consultation notes including prior notes    Lab Tests: -I ordered, reviewed, and interpreted labs.   The pertinent results include:   Labs Reviewed  COMPREHENSIVE METABOLIC PANEL WITH GFR - Abnormal; Notable for the following components:  Result Value   Glucose, Bld 102 (*)    All other components within normal limits  CBC - Abnormal; Notable for the following components:   RBC 5.99 (*)    Hemoglobin 17.7 (*)    All other components within normal limits  URINALYSIS, ROUTINE W REFLEX MICROSCOPIC - Abnormal; Notable for the following components:   Ketones, ur 15 (*)    All other components within normal limits  CBG MONITORING, ED - Abnormal; Notable for the following components:   Glucose-Capillary 101 (*)    All other components within normal limits  CK    Notable for mild hemoconcentration  EKG   EKG Interpretation Date/Time:  Wednesday Jan 09 2024 14:49:35 EDT Ventricular Rate:  88 PR Interval:  118 QRS Duration:  102 QT Interval:  368 QTC Calculation: 446 R Axis:   97  Text Interpretation: Sinus rhythm Borderline short PR interval Consider right ventricular hypertrophy Confirmed by Hiawatha Lout ([id number]) on 01/09/2024 5:32:21 PM         Imaging Studies ordered: I ordered imaging studies including mri brain On my interpretation imaging demonstrates no acute process I independently visualized and interpreted imaging. I agree with the radiologist  interpretation   Medicines ordered and prescription drug management: Meds ordered this encounter  Medications   lactated ringers bolus 1,000 mL   ketorolac (TORADOL) 15 MG/ML injection 15 mg   prochlorperazine (COMPAZINE) injection 10 mg   diphenhydrAMINE (BENADRYL) injection 12.5 mg    -I have reviewed the patients home medicines and have made adjustments as needed   Social Determinants of Health:  Diagnosis or treatment significantly limited by social determinants of health: obesity   Co morbidities that complicate the patient evaluation  Past Medical History:  Diagnosis Date   GERD (gastroesophageal reflux disease)    Transposition great arteries       Dispostion: Disposition decision including need for hospitalization was considered, and patient discharged from emergency department.    Final Clinical Impression(s) / ED Diagnoses Final diagnoses:  Tingling of left upper extremity     This chart was dictated using voice recognition software.  Despite best efforts to proofread,  errors can occur which can change the documentation meaning.    Mordecai Applebaum, MD 01/09/24 [phone]

## 2024-01-09 NOTE — ED Triage Notes (Signed)
 Ambulatory to triage with mother Pt reports approx 1230 left leg became numb and tingling. Pt called EMS and while in back of truck left arm became numb and tingling. Feels lightheaded, but is lightheaded frequently. Denies dizziness or headache. Reports some pain behind eyes intermittently. No neurological deficits noted

## 2024-01-09 NOTE — Discharge Instructions (Signed)
 We evaluated you for your tingling sensation and weakness.  Your MRI was negative for any sign of a stroke.  We think your symptoms could be related to a type of migraine called a complex migraine.  Please follow-up with your primary doctor and your cardiologist.  Please return for any new or worsening symptoms.  The medicine we gave you called Compazine can sometimes cause a unpleasant reaction.  We offered additional Benadryl in the emergency department which is the treatment for this type of reaction.  You can also take oral Benadryl at home since you did not want to take Benadryl in the emergency department.
